# Patient Record
Sex: Male | Born: 1985 | Race: Black or African American | Hispanic: No | Marital: Single | Smoking: Current every day smoker
Health system: Southern US, Community
[De-identification: ages and names within clinical notes are randomized; demographics above are authoritative.]

## PROBLEM LIST (undated history)

## (undated) DIAGNOSIS — J45909 Unspecified asthma, uncomplicated: Secondary | ICD-10-CM

## (undated) DIAGNOSIS — T7840XA Allergy, unspecified, initial encounter: Secondary | ICD-10-CM

## (undated) HISTORY — PX: SHOULDER SURGERY: SHX246

## (undated) HISTORY — DX: Allergy, unspecified, initial encounter: T78.40XA

---

## 2005-11-13 ENCOUNTER — Emergency Department (HOSPITAL_COMMUNITY): Admission: EM | Admit: 2005-11-13 | Discharge: 2005-11-13 | Payer: Self-pay | Admitting: Emergency Medicine

## 2006-11-13 ENCOUNTER — Emergency Department (HOSPITAL_COMMUNITY): Admission: EM | Admit: 2006-11-13 | Discharge: 2006-11-13 | Payer: Self-pay | Admitting: Emergency Medicine

## 2007-08-03 ENCOUNTER — Emergency Department (HOSPITAL_COMMUNITY): Admission: EM | Admit: 2007-08-03 | Discharge: 2007-08-03 | Payer: Self-pay | Admitting: Emergency Medicine

## 2009-01-31 ENCOUNTER — Emergency Department (HOSPITAL_COMMUNITY): Admission: EM | Admit: 2009-01-31 | Discharge: 2009-01-31 | Payer: Self-pay | Admitting: Emergency Medicine

## 2009-12-29 ENCOUNTER — Emergency Department (HOSPITAL_COMMUNITY): Admission: EM | Admit: 2009-12-29 | Discharge: 2009-12-29 | Payer: Self-pay | Admitting: Emergency Medicine

## 2010-04-27 ENCOUNTER — Emergency Department (HOSPITAL_COMMUNITY): Admission: EM | Admit: 2010-04-27 | Discharge: 2010-04-27 | Payer: Self-pay | Admitting: Emergency Medicine

## 2010-05-12 ENCOUNTER — Emergency Department (HOSPITAL_COMMUNITY): Admission: EM | Admit: 2010-05-12 | Discharge: 2010-05-12 | Payer: Self-pay | Admitting: Emergency Medicine

## 2013-01-11 ENCOUNTER — Emergency Department (HOSPITAL_COMMUNITY)
Admission: EM | Admit: 2013-01-11 | Discharge: 2013-01-11 | Disposition: A | Discharge status: Home / Self Care | Payer: BC Managed Care – PPO | Attending: Emergency Medicine | Admitting: Emergency Medicine

## 2013-01-11 ENCOUNTER — Encounter (HOSPITAL_COMMUNITY): Payer: Self-pay | Admitting: Emergency Medicine

## 2013-01-11 DIAGNOSIS — J069 Acute upper respiratory infection, unspecified: Secondary | ICD-10-CM | POA: Insufficient documentation

## 2013-01-11 DIAGNOSIS — F17200 Nicotine dependence, unspecified, uncomplicated: Secondary | ICD-10-CM | POA: Insufficient documentation

## 2013-01-11 DIAGNOSIS — R0982 Postnasal drip: Secondary | ICD-10-CM | POA: Insufficient documentation

## 2013-01-11 DIAGNOSIS — J310 Chronic rhinitis: Secondary | ICD-10-CM

## 2013-01-11 DIAGNOSIS — J45909 Unspecified asthma, uncomplicated: Secondary | ICD-10-CM | POA: Insufficient documentation

## 2013-01-11 DIAGNOSIS — J3489 Other specified disorders of nose and nasal sinuses: Secondary | ICD-10-CM | POA: Insufficient documentation

## 2013-01-11 DIAGNOSIS — J029 Acute pharyngitis, unspecified: Secondary | ICD-10-CM

## 2013-01-11 HISTORY — DX: Unspecified asthma, uncomplicated: J45.909

## 2013-01-11 MED ORDER — FLUTICASONE PROPIONATE 50 MCG/ACT NA SUSP: 2.0000 | Freq: Every day | NASAL | Status: DC

## 2013-01-11 MED ORDER — ALBUTEROL SULFATE HFA 108 (90 BASE) MCG/ACT IN AERS
2.0000 | INHALATION_SPRAY | Freq: Once | RESPIRATORY_TRACT | Status: AC
  Administered 2013-01-11: 2 via RESPIRATORY_TRACT
  Filled 2013-01-11: qty 6.7

## 2013-01-11 NOTE — ED Provider Notes (Signed)
History     CSN: [id number]  Arrival date & time 01/11/13  1701   First MD Initiated Contact with Patient 01/11/13 1725      Chief Complaint  Patient presents with  . Sore Throat    (Consider location/radiation/quality/duration/timing/severity/associated sxs/prior treatment) HPI Comments: 27 year old male presents emergency department with his girlfriend complaining of a sore throat x1 day. States he woke up this morning when the symptoms began. He is concerned because he is traveling to [facility] on Friday and does not want this to get worse because when he is sick his asthma becomes exacerbated. He does not have an inhaler at this time. States his sore throat is not that bad "yet". Denies difficulty swallowing, breathing, fever, chills, cough. States he feels drip down the back of his throat causing him to clear his throat. His girlfriend is beginning to have similar symptoms.  Patient is a 27 y.o. male presenting with pharyngitis. The history is provided by the patient and a significant other.  Sore Throat Associated symptoms include a sore throat. Pertinent negatives include no chills, coughing or fever.    Past Medical History  Diagnosis Date  . Asthma     Past Surgical History  Procedure Laterality Date  . Shoulder surgery Left     No family history on file.  History  Substance Use Topics  . Smoking status: Current Every Day Smoker  . Smokeless tobacco: Not on file  . Alcohol Use: Yes      Review of Systems  Constitutional: Negative for fever and chills.  HENT: Positive for sore throat and postnasal drip. Negative for trouble swallowing and sinus pressure.   Respiratory: Negative for cough and shortness of breath.   All other systems reviewed and are negative.    Allergies  Review of patient's allergies indicates no known allergies.  Home Medications  No current outpatient prescriptions on file.  BP 118/57  Pulse 67  Temp(Src) 98.5 F (36.9 C) (Oral)   SpO2 97%  Physical Exam  Nursing note and vitals reviewed. Constitutional: He is oriented to person, place, and time. He appears well-developed and well-nourished. No distress.  HENT:  Head: Normocephalic and atraumatic.  Nose: Mucosal edema present. Right sinus exhibits no maxillary sinus tenderness and no frontal sinus tenderness. Left sinus exhibits no maxillary sinus tenderness and no frontal sinus tenderness.  Mouth/Throat: Uvula is midline and mucous membranes are normal. Posterior oropharyngeal erythema present. No oropharyngeal exudate or posterior oropharyngeal edema.  Postnasal drip present.  Eyes: Conjunctivae are normal.  Neck: Normal range of motion. Neck supple.  Cardiovascular: Normal rate, regular rhythm and normal heart sounds.   Pulmonary/Chest: Effort normal and breath sounds normal. No respiratory distress. He has no wheezes.  Musculoskeletal: Normal range of motion. He exhibits no edema.  Lymphadenopathy:    He has no cervical adenopathy.  Neurological: He is alert and oriented to person, place, and time.  Skin: Skin is warm and dry. He is not diaphoretic.  Psychiatric: He has a normal mood and affect. His behavior is normal.    ED Course  Procedures (including critical care time)  Labs Reviewed - No data to display No results found.   1. URI (upper respiratory infection)   2. Non-allergic rhinitis   3. Sore throat       MDM  27 year old male with viral URI. Marked mucosal edema and post nasal drip present on exam. He is in no apparent distress with normal vital signs. I prescribed him an  inhaler along with Flonase. Discussed importance of salt-water gargles and nasal saline rinses. Return precautions discussed. Patient states understanding of plan and is agreeable to      Trevor Mace, PA-C 01/11/13 1750

## 2013-01-11 NOTE — ED Notes (Signed)
Pt states that he woke up feeling sick/sore throat and is worried that he won't be able to breathe because his asthma becomes exacerbated when he is sick and he does not have an inhaler.

## 2013-01-15 NOTE — ED Provider Notes (Signed)
Medical screening examination/treatment/procedure(s) were performed by non-physician practitioner and as supervising physician I was immediately available for consultation/collaboration.    Jerric Oyen L Mixtli Reno, MD 01/15/13 1508 

## 2013-02-21 ENCOUNTER — Emergency Department (HOSPITAL_COMMUNITY): Payer: BC Managed Care – PPO

## 2013-02-21 ENCOUNTER — Encounter (HOSPITAL_COMMUNITY): Payer: Self-pay | Admitting: Emergency Medicine

## 2013-02-21 ENCOUNTER — Emergency Department (HOSPITAL_COMMUNITY)
Admission: EM | Admit: 2013-02-21 | Discharge: 2013-02-21 | Disposition: A | Discharge status: Home / Self Care | Payer: BC Managed Care – PPO | Attending: Emergency Medicine | Admitting: Emergency Medicine

## 2013-02-21 DIAGNOSIS — R6884 Jaw pain: Secondary | ICD-10-CM | POA: Insufficient documentation

## 2013-02-21 DIAGNOSIS — J45909 Unspecified asthma, uncomplicated: Secondary | ICD-10-CM | POA: Insufficient documentation

## 2013-02-21 DIAGNOSIS — K047 Periapical abscess without sinus: Secondary | ICD-10-CM

## 2013-02-21 DIAGNOSIS — R509 Fever, unspecified: Secondary | ICD-10-CM | POA: Insufficient documentation

## 2013-02-21 DIAGNOSIS — R1310 Dysphagia, unspecified: Secondary | ICD-10-CM | POA: Insufficient documentation

## 2013-02-21 DIAGNOSIS — R220 Localized swelling, mass and lump, head: Secondary | ICD-10-CM | POA: Insufficient documentation

## 2013-02-21 DIAGNOSIS — F17200 Nicotine dependence, unspecified, uncomplicated: Secondary | ICD-10-CM | POA: Insufficient documentation

## 2013-02-21 DIAGNOSIS — Z79899 Other long term (current) drug therapy: Secondary | ICD-10-CM | POA: Insufficient documentation

## 2013-02-21 LAB — CBC
HCT: 46.7 % (ref 39.0–52.0)
MCHC: 33.8 g/dL (ref 30.0–36.0)
Platelets: 231 10*3/uL (ref 150–400)
RDW: 12 % (ref 11.5–15.5)

## 2013-02-21 LAB — POCT I-STAT, CHEM 8
Glucose, Bld: 106 mg/dL — ABNORMAL HIGH (ref 70–99)
HCT: 51 % (ref 39.0–52.0)
Hemoglobin: 17.3 g/dL — ABNORMAL HIGH (ref 13.0–17.0)
Potassium: 3.8 mEq/L (ref 3.5–5.1)

## 2013-02-21 MED ORDER — CLINDAMYCIN PHOSPHATE 600 MG/50ML IV SOLN
600.0000 mg | Freq: Once | INTRAVENOUS | Status: AC
  Administered 2013-02-21: 600 mg via INTRAVENOUS
  Filled 2013-02-21: qty 50

## 2013-02-21 MED ORDER — IOHEXOL 300 MG/ML  SOLN
100.0000 mL | Freq: Once | INTRAMUSCULAR | Status: AC | PRN
  Administered 2013-02-21: 100 mL via INTRAVENOUS

## 2013-02-21 MED ORDER — IBUPROFEN 800 MG PO TABS
800.0000 mg | ORAL_TABLET | Freq: Once | ORAL | Status: AC
  Administered 2013-02-21: 800 mg via ORAL
  Filled 2013-02-21: qty 1

## 2013-02-21 MED ORDER — CLINDAMYCIN HCL 150 MG PO CAPS: 300.0000 mg | ORAL_CAPSULE | Freq: Three times a day (TID) | ORAL | Status: DC

## 2013-02-21 MED ORDER — OXYCODONE-ACETAMINOPHEN 5-325 MG PO TABS: 1.0000 | ORAL_TABLET | ORAL | Status: DC | PRN

## 2013-02-21 NOTE — ED Notes (Signed)
Pt states that he started having L mouth pain x 2 days ago and woke up with the L side of his face swollen. L lower dental pain.

## 2013-02-21 NOTE — ED Provider Notes (Signed)
History    This chart was scribed for non-physician practitioner working with Ward Givens, MD by Sofie Rower, ED Scribe. This patient was seen in room WTR7/WTR7 and the patient's care was started at 4:50PM.   CSN: [id number]  Arrival date & time 02/21/13  1636   First MD Initiated Contact with Patient 02/21/13 1650      Chief Complaint  Patient presents with  . Dental Pain    (Consider location/radiation/quality/duration/timing/severity/associated sxs/prior treatment) The history is provided by the patient. No language interpreter was used.    Mark Leon is a 27 y.o. male , with a hx of asthma, who presents to the Emergency Department complaining of non radiating, dental pain located at his left lower jaw, onset three days ago (02/18/13). Associated symptoms include fever (99.8 taken at [facility] on 02/21/13), chills, left sided facial swelling beginning yesterday (02/20/13), difficulty swallowing, and difficulty opening the jaw. The pt reports he is experiencing dental pain and swelling located at the left side of his jaw, which is inhibiting his ability to swallow and open his mouth. Additionally, the pt rates his dental pain at  6/10 at the present point and time. Modifying factors include application of pressure upon the left side of the face, specifically, while sleeping, which intensifies the dental pain.   The pt denies pain with swallowing, difficulty breathing, hoarse voice, nausea, and vomiting.   Furthermore, the pt denies any hx of dental abscess. The last time the pt was evaluated by a dentist was in 2010.   The pt is a current everyday smoker, in addition to drinking alcohol.   Pt does not have a Dentist at this time.    Past Medical History  Diagnosis Date  . Asthma     Past Surgical History  Procedure Laterality Date  . Shoulder surgery Left     History reviewed. No pertinent family history.  History  Substance Use Topics  . Smoking status: Current Every Day Smoker   . Smokeless tobacco: Not on file  . Alcohol Use: Yes      Review of Systems  Constitutional: Positive for fever and chills.  HENT: Positive for facial swelling, trouble swallowing and dental problem. Negative for sore throat, drooling, mouth sores, neck pain and voice change.   Respiratory: Negative for chest tightness and shortness of breath.   Cardiovascular: Negative for chest pain.  Gastrointestinal: Negative for nausea and vomiting.  Skin: Negative.   All other systems reviewed and are negative.    Allergies  Review of patient's allergies indicates no known allergies.  Home Medications   Current Outpatient Rx  Name  Route  Sig  Dispense  Refill  . albuterol (PROVENTIL HFA;VENTOLIN HFA) 108 (90 BASE) MCG/ACT inhaler   Inhalation   Inhale 2 puffs into the lungs every 6 (six) hours as needed for wheezing.         . clindamycin (CLEOCIN) 150 MG capsule   Oral   Take 2 capsules (300 mg total) by mouth 3 (three) times daily.   30 capsule   0   . oxyCODONE-acetaminophen (PERCOCET/ROXICET) 5-325 MG per tablet   Oral   Take 1 tablet by mouth every 4 (four) hours as needed for pain.   8 tablet   0     BP 135/74  Pulse 95  Temp(Src) 99.8 F (37.7 C) (Oral)  SpO2 100%  Physical Exam  Nursing note and vitals reviewed. Constitutional: He is oriented to person, place, and time. He appears  well-developed and well-nourished. No distress.  HENT:  Head: Normocephalic and atraumatic. There is trismus in the jaw.  Mouth/Throat: Mucous membranes are normal. He does not have dentures. No oral lesions. Normal dentition. Dental caries present. No dental abscesses.  Eyes: Conjunctivae and EOM are normal. Pupils are equal, round, and reactive to light.  Neck: Trachea normal, normal range of motion and full passive range of motion without pain. Neck supple. No spinous process tenderness and no muscular tenderness present. No rigidity. No tracheal deviation, no edema, no erythema  and normal range of motion present.    Visible soft tissue swelling left lower jaw area.  No erythema or warmth.   Cardiovascular: Normal rate, regular rhythm and normal heart sounds.  Exam reveals no gallop and no friction rub.   No murmur heard. Pulmonary/Chest: Effort normal and breath sounds normal. No respiratory distress. He has no wheezes.  Musculoskeletal: Normal range of motion.  Neurological: He is alert and oriented to person, place, and time.  Skin: Skin is warm and dry.  Psychiatric: He has a normal mood and affect. His behavior is normal.    ED Course  Procedures (including critical care time)  DIAGNOSTIC STUDIES: Oxygen Saturation is 100% on room air, normal by my interpretation.    COORDINATION OF CARE:   5:03 PM- Treatment plan concerning CT scan discussed with patient. Pt agrees with treatment.     Labs Reviewed  CBC - Abnormal; Notable for the following:    WBC 14.3 (*)    All other components within normal limits  POCT I-STAT, CHEM 8 - Abnormal; Notable for the following:    Glucose, Bld 106 (*)    Hemoglobin 17.3 (*)    All other components within normal limits    Results for orders placed during the hospital encounter of 02/21/13  CBC      Result Value Range   WBC 14.3 (*) 4.0 - 10.5 K/uL   RBC 4.97  4.22 - 5.81 MIL/uL   Hemoglobin 15.8  13.0 - 17.0 g/dL   HCT 16.1  09.6 - 04.5 %   MCV 94.0  78.0 - 100.0 fL   MCH 31.8  26.0 - 34.0 pg   MCHC 33.8  30.0 - 36.0 g/dL   RDW 40.9  81.1 - 91.4 %   Platelets 231  150 - 400 K/uL  POCT I-STAT, CHEM 8      Result Value Range   Sodium 141  135 - 145 mEq/L   Potassium 3.8  3.5 - 5.1 mEq/L   Chloride 104  96 - 112 mEq/L   BUN 12  6 - 23 mg/dL   Creatinine, Ser 7.82  0.50 - 1.35 mg/dL   Glucose, Bld 956 (*) 70 - 99 mg/dL   Calcium, Ion 2.13  0.86 - 1.23 mmol/L   TCO2 29  0 - 100 mmol/L   Hemoglobin 17.3 (*) 13.0 - 17.0 g/dL   HCT 57.8  46.9 - 62.9 %   Ct Maxillofacial W/cm  02/21/2013  *RADIOLOGY  REPORT*  Clinical Data: Dental pain in the left lower jaw.  Fever.  Left- sided facial swelling.  Difficulty swallowing.  Trismus.  CT MAXILLOFACIAL WITH CONTRAST  Technique:  Multidetector CT imaging of the maxillofacial structures was performed with intravenous contrast. Multiplanar CT image reconstructions were also generated.  Contrast: OMNIPAQUE IOHEXOL 300 MG/ML  SOLN  Comparison: None.  Findings: A large dental caries is present in the first left mandibular molar (tooth number 19).  There are.  Lucencies are present about the roots of both the first and second molars on the left.  Previous dental work is noted at the second molar.  No definite cortical erosion is present at either tooth.  However, the lucency extends to the alveolar ridge.  A subperiosteal abscess is evident along the buccal surface of the alveolar ridge of the left mandible, measuring 16 x 5 x 12 mm.  There is additional hypoattenuation extending posteriorly and superiorly within the masseter muscle.  No discrete abscess is present within the muscle.  Extensive subcutaneous edematous changes are present over left face.  Enlarged left submandibular lymph nodes are present.  The largest node measures 2.3 x 1.1 cm.  Additional level II lymph nodes are present bilaterally.  Limited imaging of the brain is unremarkable.  Limited imaging of the upper cervical spine is unremarkable.  IMPRESSION:  1.  Large dental caries involving the left first mandibular molar. 2.  Subperiosteal abscess along the vocal surface of the alveolar ridge at the level of that tooth. 3.  Extensive subcutaneous soft tissue swelling over the left side the face. 4.  Asymmetrically enlarged left submandibular lymph nodes.   Original Report Authenticated By: Marin Roberts, M.D.       1. Abscess, dental       MDM  Patient with facial swelling and dental pain x 2 days. Assoc. Subjective fevers and chills. Denies SOB. Soft tissue swelling in left lower jaw.  No visible dental abscess. No erythema or drainage in mouth.  CT maxillofacial w/ contrast shows subperiosteal abscess.  Will treat with Abx and pain medicine.  Urged patient to follow-up with dentist w/in 48 hours. Patient d/w with Dr. Lynelle Doctor, agrees with plan. Patient given return precautions. Patient agreeable to plan. Patient stable at time of discharge.          I personally performed the services described in this documentation, which was scribed in my presence. The recorded information has been reviewed and is accurate.     Jeannetta Ellis, PA-C 02/22/13 [phone]

## 2013-02-22 NOTE — ED Provider Notes (Signed)
Medical screening examination/treatment/procedure(s) were performed by non-physician practitioner and as supervising physician I was immediately available for consultation/collaboration. Devoria Albe, MD, Armando Gang   Ward Givens, MD 02/22/13 [phone]

## 2013-04-16 ENCOUNTER — Encounter (HOSPITAL_COMMUNITY): Payer: Self-pay | Admitting: *Deleted

## 2013-04-16 ENCOUNTER — Emergency Department (HOSPITAL_COMMUNITY)
Admission: EM | Admit: 2013-04-16 | Discharge: 2013-04-16 | Disposition: A | Discharge status: Home / Self Care | Payer: BC Managed Care – PPO | Attending: Emergency Medicine | Admitting: Emergency Medicine

## 2013-04-16 DIAGNOSIS — Z79899 Other long term (current) drug therapy: Secondary | ICD-10-CM | POA: Insufficient documentation

## 2013-04-16 DIAGNOSIS — J3489 Other specified disorders of nose and nasal sinuses: Secondary | ICD-10-CM | POA: Insufficient documentation

## 2013-04-16 DIAGNOSIS — R05 Cough: Secondary | ICD-10-CM | POA: Insufficient documentation

## 2013-04-16 DIAGNOSIS — R059 Cough, unspecified: Secondary | ICD-10-CM | POA: Insufficient documentation

## 2013-04-16 DIAGNOSIS — F17200 Nicotine dependence, unspecified, uncomplicated: Secondary | ICD-10-CM | POA: Insufficient documentation

## 2013-04-16 DIAGNOSIS — J4521 Mild intermittent asthma with (acute) exacerbation: Secondary | ICD-10-CM

## 2013-04-16 DIAGNOSIS — J45901 Unspecified asthma with (acute) exacerbation: Secondary | ICD-10-CM | POA: Insufficient documentation

## 2013-04-16 MED ORDER — PREDNISONE 20 MG PO TABS: 40.0000 mg | ORAL_TABLET | Freq: Every day | ORAL | Status: DC

## 2013-04-16 MED ORDER — ALBUTEROL SULFATE HFA 108 (90 BASE) MCG/ACT IN AERS
2.0000 | INHALATION_SPRAY | Freq: Once | RESPIRATORY_TRACT | Status: AC
  Administered 2013-04-16: 2 via RESPIRATORY_TRACT
  Filled 2013-04-16: qty 6.7

## 2013-04-16 NOTE — ED Notes (Signed)
Pt states he caught a virus on Tuesday and Wednesday he had fevers and chills,  Nasal congestion

## 2013-04-16 NOTE — ED Provider Notes (Signed)
History     CSN: [id number]  Arrival date & time 04/16/13  4098   First MD Initiated Contact with Patient 04/16/13 [phone]      Chief Complaint  Patient presents with  . Nasal Congestion    (Consider location/radiation/quality/duration/timing/severity/associated sxs/prior treatment) HPI Comments: 27 year old male, history of asthma, presents with a complaint of coughing and shortness of breath. He states that he has been congested in his chest for several days, persistent, nothing makes better or worse, not associated with fevers chills nausea vomiting abdominal pain back pain swelling or rashes. He has been out of his albuterol medications for some time, trying to make them last but has not been able to get them refilled. He has not had treatment for this prior to arrival. He states that the cough is nonproductive  The history is provided by the patient and a friend.    Past Medical History  Diagnosis Date  . Asthma     Past Surgical History  Procedure Laterality Date  . Shoulder surgery Left     No family history on file.  History  Substance Use Topics  . Smoking status: Current Every Day Smoker  . Smokeless tobacco: Not on file  . Alcohol Use: Yes      Review of Systems  All other systems reviewed and are negative.    Allergies  Review of patient's allergies indicates no known allergies.  Home Medications   Current Outpatient Rx  Name  Route  Sig  Dispense  Refill  . albuterol (PROVENTIL HFA;VENTOLIN HFA) 108 (90 BASE) MCG/ACT inhaler   Inhalation   Inhale 2 puffs into the lungs every 6 (six) hours as needed for wheezing.         Marland Kitchen guaiFENesin (MUCINEX) 600 MG 12 hr tablet   Oral   Take 600 mg by mouth 2 (two) times daily as needed for congestion.         Marland Kitchen OVER THE COUNTER MEDICATION   Oral   Take 1 tablet by mouth daily as needed (for allergies). OTC generic allergy medication         . predniSONE (DELTASONE) 20 MG tablet   Oral   Take 2  tablets (40 mg total) by mouth daily.   10 tablet   0     BP 121/71  Pulse 75  Temp(Src) 98.5 F (36.9 C) (Oral)  Resp 18  SpO2 98%  Physical Exam  Nursing note and vitals reviewed. Constitutional: He appears well-developed and well-nourished. No distress.  HENT:  Head: Normocephalic and atraumatic.  Mouth/Throat: Oropharynx is clear and moist. No oropharyngeal exudate.  Eyes: Conjunctivae and EOM are normal. Pupils are equal, round, and reactive to light. Right eye exhibits no discharge. Left eye exhibits no discharge. No scleral icterus.  Neck: Normal range of motion. Neck supple. No JVD present. No thyromegaly present.  Cardiovascular: Normal rate, regular rhythm, normal heart sounds and intact distal pulses.  Exam reveals no gallop and no friction rub.   No murmur heard. Pulmonary/Chest: Effort normal. No respiratory distress. He has wheezes. He has no rales.  Abdominal: Soft. Bowel sounds are normal. He exhibits no distension and no mass. There is no tenderness.  Musculoskeletal: Normal range of motion. He exhibits no edema and no tenderness.  Lymphadenopathy:    He has no cervical adenopathy.  Neurological: He is alert. Coordination normal.  Skin: Skin is warm and dry. No rash noted. No erythema.  Psychiatric: He has a normal mood and affect. His  behavior is normal.    ED Course  Procedures (including critical care time)  Labs Reviewed - No data to display No results found.   1. Asthma, intermittent with acute exacerbation       MDM  The patient is in no respiratory distress but he does have diffuse expiratory wheezing and prolonged expiratory phase. He is speaking in full sentences, oxygen of 98-100% and is afebrile with no tachycardia or tachypnea. We'll give treatment with albuterol MDI with spacer, prednisone 5 day course, followup with family Dr. Patient expresses understanding to the treatment plan.   Meds given in ED:  Medications  albuterol (PROVENTIL  HFA;VENTOLIN HFA) 108 (90 BASE) MCG/ACT inhaler 2 puff (not administered)    New Prescriptions   PREDNISONE (DELTASONE) 20 MG TABLET    Take 2 tablets (40 mg total) by mouth daily.            Vida Roller, MD 04/16/13 [phone]

## 2013-04-16 NOTE — ED Notes (Signed)
Discharge instructions reviewed w/ pt., verbalizes understanding. One prescription provided at discharge, inhaler sent home w/ pt as well.

## 2013-11-20 ENCOUNTER — Emergency Department (HOSPITAL_COMMUNITY)
Admission: EM | Admit: 2013-11-20 | Discharge: 2013-11-20 | Disposition: A | Discharge status: Home / Self Care | Payer: BC Managed Care – PPO | Attending: Emergency Medicine | Admitting: Emergency Medicine

## 2013-11-20 ENCOUNTER — Emergency Department (HOSPITAL_COMMUNITY): Payer: BC Managed Care – PPO

## 2013-11-20 ENCOUNTER — Encounter (HOSPITAL_COMMUNITY): Payer: Self-pay | Admitting: Emergency Medicine

## 2013-11-20 DIAGNOSIS — R197 Diarrhea, unspecified: Secondary | ICD-10-CM | POA: Insufficient documentation

## 2013-11-20 DIAGNOSIS — Z79899 Other long term (current) drug therapy: Secondary | ICD-10-CM | POA: Insufficient documentation

## 2013-11-20 DIAGNOSIS — J45901 Unspecified asthma with (acute) exacerbation: Secondary | ICD-10-CM

## 2013-11-20 DIAGNOSIS — F17200 Nicotine dependence, unspecified, uncomplicated: Secondary | ICD-10-CM | POA: Insufficient documentation

## 2013-11-20 MED ORDER — PREDNISONE 10 MG PO TABS: ORAL_TABLET | ORAL | Status: DC

## 2013-11-20 MED ORDER — ALBUTEROL SULFATE HFA 108 (90 BASE) MCG/ACT IN AERS
2.0000 | INHALATION_SPRAY | Freq: Once | RESPIRATORY_TRACT | Status: AC
  Administered 2013-11-20: 2 via RESPIRATORY_TRACT
  Filled 2013-11-20: qty 6.7

## 2013-11-20 MED ORDER — PREDNISONE 20 MG PO TABS
60.0000 mg | ORAL_TABLET | Freq: Once | ORAL | Status: AC
  Administered 2013-11-20: 60 mg via ORAL
  Filled 2013-11-20: qty 3

## 2013-11-20 MED ORDER — ALBUTEROL SULFATE (2.5 MG/3ML) 0.083% IN NEBU
5.0000 mg | INHALATION_SOLUTION | Freq: Once | RESPIRATORY_TRACT | Status: AC
  Administered 2013-11-20: 5 mg via RESPIRATORY_TRACT
  Filled 2013-11-20: qty 6

## 2013-11-20 NOTE — Discharge Instructions (Signed)
Use your albuterol inhaler 2 puffs every 4 hrs. Take prednisone as prescribed until all gone. Follow up with your doctor.    Asthma, Adult Asthma is a recurring condition in which the airways tighten and narrow. Asthma can make it difficult to breathe. It can cause coughing, wheezing, and shortness of breath. Asthma episodes (also called asthma attacks) range from minor to life-threatening. Asthma cannot be cured, but medicines and lifestyle changes can help control it. CAUSES Asthma is believed to be caused by inherited (genetic) and environmental factors, but its exact cause is unknown. Asthma may be triggered by allergens, lung infections, or irritants in the air. Asthma triggers are different for each person. Common triggers include:   Animal dander.  Dust mites.  Cockroaches.  Pollen from trees or grass.  Mold.  Smoke.  Air pollutants such as dust, household cleaners, hair sprays, aerosol sprays, paint fumes, strong chemicals, or strong odors.  Cold air, weather changes, and winds (which increase molds and pollens in the air).  Strong emotional expressions such as crying or laughing hard.  Stress.  Certain medicines (such as aspirin) or types of drugs (such as beta-blockers).  Sulfites in foods and drinks. Foods and drinks that may contain sulfites include dried fruit, potato chips, and sparkling grape juice.  Infections or inflammatory conditions such as the flu, a cold, or an inflammation of the nasal membranes (rhinitis).  Gastroesophageal reflux disease (GERD).  Exercise or strenuous activity. SYMPTOMS Symptoms may occur immediately after asthma is triggered or many hours later. Symptoms include:  Wheezing.  Excessive nighttime or early morning coughing.  Frequent or severe coughing with a common cold.  Chest tightness.  Shortness of breath. DIAGNOSIS  The diagnosis of asthma is made by a review of your medical history and a physical exam. Tests may also be  performed. These may include:  Lung function studies. These tests show how much air you breath in and out.  Allergy tests.  Imaging tests such as X-rays. TREATMENT  Asthma cannot be cured, but it can usually be controlled. Treatment involves identifying and avoiding your asthma triggers. It also involves medicines. There are 2 classes of medicine used for asthma treatment:   Controller medicines. These prevent asthma symptoms from occurring. They are usually taken every day.  Reliever or rescue medicines. These quickly relieve asthma symptoms. They are used as needed and provide short-term relief. Your health care provider will help you create an asthma action plan. An asthma action plan is a written plan for managing and treating your asthma attacks. It includes a list of your asthma triggers and how they may be avoided. It also includes information on when medicines should be taken and when their dosage should be changed. An action plan may also involve the use of a device called a peak flow meter. A peak flow meter measures how well the lungs are working. It helps you monitor your condition. HOME CARE INSTRUCTIONS   Take medicine as directed by your health care provider. Speak with your health care provider if you have questions about how or when to take the medicines.  Use a peak flow meter as directed by your health care provider. Record and keep track of readings.  Understand and use the action plan to help minimize or stop an asthma attack without needing to seek medical care.  Control your home environment in the following ways to help prevent asthma attacks:  Do not smoke. Avoid being exposed to secondhand smoke.  Change your  heating and air conditioning filter regularly.  Limit your use of fireplaces and wood stoves.  Get rid of pests (such as roaches and mice) and their droppings.  Throw away plants if you see mold on them.  Clean your floors and dust regularly. Use  unscented cleaning products.  Try to have someone else vacuum for you regularly. Stay out of rooms while they are being vacuumed and for a short while afterward. If you vacuum, use a dust mask from a hardware store, a double-layered or microfilter vacuum cleaner bag, or a vacuum cleaner with a HEPA filter.  Replace carpet with wood, tile, or vinyl flooring. Carpet can trap dander and dust.  Use allergy-proof pillows, mattress covers, and box spring covers.  Wash bed sheets and blankets every week in hot water and dry them in a dryer.  Use blankets that are made of polyester or cotton.  Clean bathrooms and kitchens with bleach. If possible, have someone repaint the walls in these rooms with mold-resistant paint. Keep out of the rooms that are being cleaned and painted.  Wash hands frequently. SEEK MEDICAL CARE IF:   You have wheezing, shortness of breath, or a cough even if taking medicine to prevent attacks.  The colored mucus you cough up (sputum) is thicker than usual.  Your sputum changes from clear or white to yellow, green, gray, or bloody.  You have any problems that may be related to the medicines you are taking (such as a rash, itching, swelling, or trouble breathing).  You are using a reliever medicine more than 2 3 times per week.  Your peak flow is still at 50 79% of you personal best after following your action plan for 1 hour. SEEK IMMEDIATE MEDICAL CARE IF:   You seem to be getting worse and are unresponsive to treatment during an asthma attack.  You are short of breath even at rest.  You get short of breath when doing very little physical activity.  You have difficulty eating, drinking, or talking due to asthma symptoms.  You develop chest pain.  You develop a fast heartbeat.  You have a bluish color to your lips or fingernails.  You are lightheaded, dizzy, or faint.  Your peak flow is less than 50% of your personal best.  You have a fever or persistent  symptoms for more than 2 3 days.  You have a fever and symptoms suddenly get worse. MAKE SURE YOU:   Understand these instructions.  Will watch your condition.  Will get help right away if you are not doing well or get worse. Document Released: 11/02/2005 Document Revised: 07/05/2013 Document Reviewed: 06/01/2013 [facility] Patient Information 2014 [place], [place].

## 2013-11-20 NOTE — ED Notes (Signed)
Pt c/o chest tightness that started last night but the chest pain didn't start until this morning while at work.  Pt states he had one episode of diarrhea this morning.  Pt has asthma.

## 2013-11-20 NOTE — ED Provider Notes (Signed)
CSN: [id number]     Arrival date & time 11/20/13  1750 History   First MD Initiated Contact with Patient 11/20/13 2113     Chief Complaint  Patient presents with  . Asthma  . Chest Pain  . Diarrhea   (Consider location/radiation/quality/duration/timing/severity/associated sxs/prior Treatment) HPI Mark Leon is a 28 y.o. male who presents to emergency department and complaining of chest pain or shortness of breath. He states symptoms have been on and off since yesterday. He states he has been using his inhaler with no improvement. He denies any cough. He states he has history of asthma and this feels similar to prior exacerbations. He denies any known asthma triggers however he states he was outside this morning and it was we will being cold. He also admits to some mold in his bathroom. He denies any fever, chills, nasal congestion, sore throat.   Past Medical History  Diagnosis Date  . Asthma    Past Surgical History  Procedure Laterality Date  . Shoulder surgery Left    No family history on file. History  Substance Use Topics  . Smoking status: Current Every Day Smoker    Types: Cigarettes  . Smokeless tobacco: Not on file  . Alcohol Use: Yes    Review of Systems  Constitutional: Negative for fever and chills.  Respiratory: Positive for chest tightness and shortness of breath. Negative for cough.   Cardiovascular: Negative for chest pain, palpitations and leg swelling.  Gastrointestinal: Negative for nausea, vomiting, abdominal pain, diarrhea and abdominal distention.  Genitourinary: Negative for dysuria, urgency, frequency and hematuria.  Musculoskeletal: Negative for arthralgias, myalgias, neck pain and neck stiffness.  Skin: Negative for rash.  Allergic/Immunologic: Negative for immunocompromised state.  Neurological: Negative for dizziness, weakness, light-headedness, numbness and headaches.    Allergies  Review of patient's allergies indicates no known  allergies.  Home Medications   Current Outpatient Rx  Name  Route  Sig  Dispense  Refill  . albuterol (PROVENTIL HFA;VENTOLIN HFA) 108 (90 BASE) MCG/ACT inhaler   Inhalation   Inhale 2 puffs into the lungs every 6 (six) hours as needed for wheezing.         Marland Kitchen guaiFENesin (MUCINEX) 600 MG 12 hr tablet   Oral   Take 600 mg by mouth 2 (two) times daily as needed for congestion.          BP 135/78  Pulse 81  Temp(Src) 98.9 F (37.2 C) (Oral)  Resp 18  SpO2 99% Physical Exam  Nursing note and vitals reviewed. Constitutional: He appears well-developed and well-nourished. No distress.  HENT:  Head: Normocephalic.  Right Ear: External ear normal.  Left Ear: External ear normal.  Nose: Nose normal.  Mouth/Throat: Oropharynx is clear and moist.  Eyes: Conjunctivae are normal.  Neck: Normal range of motion. Neck supple.  Cardiovascular: Normal rate, regular rhythm and normal heart sounds.   Pulmonary/Chest: Effort normal and breath sounds normal. No respiratory distress. He has no wheezes. He has no rales.  Skin: Skin is warm and dry.    ED Course  Procedures (including critical care time) Labs Review Labs Reviewed - No data to display Imaging Review Dg Chest 2 View  11/20/2013   CLINICAL DATA:  Asthma in chest  EXAM: CHEST  2 VIEW  COMPARISON:  12/29/2009  FINDINGS: The heart size and mediastinal contours are within normal limits. Both lungs are clear. No pneumothorax. The visualized skeletal structures are unremarkable.  IMPRESSION: No active cardiopulmonary disease.  Electronically Signed   By: Tiburcio PeaJonathan  Watts M.D.   On: 11/20/2013 21:53    EKG Interpretation   None       MDM  No diagnosis found.  Patient with chest tightness and shortness of breath that was on and off since yesterday. History of asthma. He received a neb treatment in triage which helped. He is currently nontoxic appearing and does not appear to be in any respiratory distress. He does not have any  wheezing on exam. His lungs are clear. He's afebrile. His oxygen saturations 99% on room air. I will place him on a short steroid taper and continue albuterol inhaler. He is to followup with primary care Dr. Chest x-ray is negative.    Lottie Musselatyana A Rohith Fauth, PA-C 11/20/13 2235

## 2013-11-20 NOTE — ED Notes (Signed)
Patient transported to X-ray 

## 2013-11-21 NOTE — ED Provider Notes (Signed)
Medical screening examination/treatment/procedure(s) were performed by non-physician practitioner and as supervising physician I was immediately available for consultation/collaboration.  EKG Interpretation   None         Junius ArgyleForrest S Trelyn Vanderlinde, MD 11/21/13 1144

## 2013-11-22 ENCOUNTER — Emergency Department (HOSPITAL_COMMUNITY)
Admission: EM | Admit: 2013-11-22 | Discharge: 2013-11-22 | Disposition: A | Discharge status: Home / Self Care | Payer: BC Managed Care – PPO | Attending: Emergency Medicine | Admitting: Emergency Medicine

## 2013-11-22 ENCOUNTER — Encounter (HOSPITAL_COMMUNITY): Payer: Self-pay | Admitting: Emergency Medicine

## 2013-11-22 DIAGNOSIS — Z79899 Other long term (current) drug therapy: Secondary | ICD-10-CM | POA: Insufficient documentation

## 2013-11-22 DIAGNOSIS — J45909 Unspecified asthma, uncomplicated: Secondary | ICD-10-CM | POA: Insufficient documentation

## 2013-11-22 DIAGNOSIS — R197 Diarrhea, unspecified: Secondary | ICD-10-CM | POA: Insufficient documentation

## 2013-11-22 DIAGNOSIS — F17200 Nicotine dependence, unspecified, uncomplicated: Secondary | ICD-10-CM | POA: Insufficient documentation

## 2013-11-22 DIAGNOSIS — J111 Influenza due to unidentified influenza virus with other respiratory manifestations: Secondary | ICD-10-CM

## 2013-11-22 DIAGNOSIS — IMO0002 Reserved for concepts with insufficient information to code with codable children: Secondary | ICD-10-CM | POA: Insufficient documentation

## 2013-11-22 MED ORDER — OSELTAMIVIR PHOSPHATE 75 MG PO CAPS: 75.0000 mg | ORAL_CAPSULE | Freq: Two times a day (BID) | ORAL | Status: DC

## 2013-11-22 NOTE — ED Provider Notes (Signed)
CSN: [id number]     Arrival date & time 11/22/13  [id number] History   First MD Initiated Contact with Patient 11/22/13 1010     Chief Complaint  Patient presents with  . Diarrhea  . Chills  . Generalized Body Aches   (Consider location/radiation/quality/duration/timing/severity/associated sxs/prior Treatment) Patient is a 28 y.o. male presenting with diarrhea. The history is provided by the patient.  Diarrhea Associated symptoms: chills and fever   Associated symptoms: no headaches and no vomiting   pt c/o in past 1 day, onset subjective fever, chills, body aches, non prod cough, nasal congestion, scratchy throat, and couple episodes diarrhea, not bloody or mucousy. No known ill contacts. Denies cp or sob. No abd pain. No vomiting. Is eating and drinking. No severe headaches or neck pain/stiffness.  No recent foreign travel.      Past Medical History  Diagnosis Date  . Asthma    Past Surgical History  Procedure Laterality Date  . Shoulder surgery Left    No family history on file. History  Substance Use Topics  . Smoking status: Current Every Day Smoker    Types: Cigarettes  . Smokeless tobacco: Not on file  . Alcohol Use: Yes    Review of Systems  Constitutional: Positive for fever and chills.  HENT: Positive for congestion and rhinorrhea. Negative for trouble swallowing.   Eyes: Negative for discharge and redness.  Respiratory: Positive for cough. Negative for shortness of breath.   Cardiovascular: Negative for chest pain.  Gastrointestinal: Positive for diarrhea. Negative for vomiting.  Genitourinary: Negative for dysuria.  Musculoskeletal: Negative for back pain and neck pain.  Skin: Negative for rash.  Neurological: Negative for headaches.  Hematological: Negative for adenopathy.  Psychiatric/Behavioral: Negative for confusion.    Allergies  Review of patient's allergies indicates no known allergies.  Home Medications   Current Outpatient Rx  Name  Route  Sig   Dispense  Refill  . albuterol (PROVENTIL HFA;VENTOLIN HFA) 108 (90 BASE) MCG/ACT inhaler   Inhalation   Inhale 2 puffs into the lungs every 6 (six) hours as needed for wheezing.         Marland Kitchen. guaiFENesin (MUCINEX) 600 MG 12 hr tablet   Oral   Take 600 mg by mouth 2 (two) times daily as needed for congestion.         Marland Kitchen. oseltamivir (TAMIFLU) 75 MG capsule   Oral   Take 1 capsule (75 mg total) by mouth 2 (two) times daily.   10 capsule   0   . predniSONE (DELTASONE) 10 MG tablet      Take 5 tab day 1, take 4 tab day 2, take 3 tab day 3, take 2 tab day 4, and take 1 tab day 5   15 tablet   0    BP 113/73  Pulse 78  Temp(Src) 98.6 F (37 C) (Oral)  Resp 16  SpO2 98% Physical Exam  Nursing note and vitals reviewed. Constitutional: He is oriented to person, place, and time. He appears well-developed and well-nourished. No distress.  HENT:  Head: Atraumatic.  Mouth/Throat: Oropharynx is clear and moist.  Nasal congestion. No sinus tenderness.   Eyes: Conjunctivae are normal. Pupils are equal, round, and reactive to light. No scleral icterus.  Neck: Normal range of motion. Neck supple. No tracheal deviation present.  No stiffness or rigidity  Cardiovascular: Normal rate, regular rhythm, normal heart sounds and intact distal pulses.  Exam reveals no gallop and no friction rub.   No  murmur heard. Pulmonary/Chest: Effort normal and breath sounds normal. No accessory muscle usage. No respiratory distress.  Abdominal: Soft. Bowel sounds are normal. He exhibits no distension and no mass. There is no tenderness. There is no rebound and no guarding.  Genitourinary:  No cva tenderness  Musculoskeletal: Normal range of motion. He exhibits no edema and no tenderness.  Lymphadenopathy:    He has no cervical adenopathy.  Neurological: He is alert and oriented to person, place, and time.  Steady gait.   Skin: Skin is warm and dry. No rash noted. He is not diaphoretic.  Psychiatric: He has  a normal mood and affect.    ED Course  Procedures (including critical care time)  Dg Chest 2 View  11/20/2013   CLINICAL DATA:  Asthma in chest  EXAM: CHEST  2 VIEW  COMPARISON:  12/29/2009  FINDINGS: The heart size and mediastinal contours are within normal limits. Both lungs are clear. No pneumothorax. The visualized skeletal structures are unremarkable.  IMPRESSION: No active cardiopulmonary disease.   Electronically Signed   By: Tiburcio Pea M.D.   On: 11/20/2013 21:53      MDM  Pt tolerating po fluids.  Pulse ox 98%. No increased wob.  Pt appears stable for d/c.   Reviewed nursing notes and prior charts for additional history.      Suzi Roots, MD 11/22/13 1045

## 2013-11-22 NOTE — ED Notes (Addendum)
Pt c/o chills, body aches, and diarrhea since yesterday.  Pt has only had 2 loose stools.

## 2013-11-22 NOTE — Discharge Instructions (Signed)
Rest. Drink plenty of fluids. Take tamiflu as prescribed. Take tylenol/advil as need for symptom relief. Follow up with primary care doctor in 1 week if symptoms fail to improve/resolve. Return to ER if worse, trouble breathing, severe diarrhea, other concern.     Influenza, Adult Influenza ("the flu") is a viral infection of the respiratory tract. It occurs more often in winter months because people spend more time in close contact with one another. Influenza can make you feel very sick. Influenza easily spreads from person to person (contagious). CAUSES  Influenza is caused by a virus that infects the respiratory tract. You can catch the virus by breathing in droplets from an infected person's cough or sneeze. You can also catch the virus by touching something that was recently contaminated with the virus and then touching your mouth, nose, or eyes. SYMPTOMS  Symptoms typically last 4 to 10 days and may include:  Fever.  Chills.  Headache, body aches, and muscle aches.  Sore throat.  Chest discomfort and cough.  Poor appetite.  Weakness or feeling tired.  Dizziness.  Nausea or vomiting. DIAGNOSIS  Diagnosis of influenza is often made based on your history and a physical exam. A nose or throat swab test can be done to confirm the diagnosis. RISKS AND COMPLICATIONS You may be at risk for a more severe case of influenza if you smoke cigarettes, have diabetes, have chronic heart disease (such as heart failure) or lung disease (such as asthma), or if you have a weakened immune system. Elderly people and pregnant women are also at risk for more serious infections. The most common complication of influenza is a lung infection (pneumonia). Sometimes, this complication can require emergency medical care and may be life-threatening. PREVENTION  An annual influenza vaccination (flu shot) is the best way to avoid getting influenza. An annual flu shot is now routinely recommended for all  adults in the [place] TREATMENT  In mild cases, influenza goes away on its own. Treatment is directed at relieving symptoms. For more severe cases, your caregiver may prescribe antiviral medicines to shorten the sickness. Antibiotic medicines are not effective, because the infection is caused by a virus, not by bacteria. HOME CARE INSTRUCTIONS  Only take over-the-counter or prescription medicines for pain, discomfort, or fever as directed by your caregiver.  Use a cool mist humidifier to make breathing easier.  Get plenty of rest until your temperature returns to normal. This usually takes 3 to 4 days.  Drink enough fluids to keep your urine clear or pale yellow.  Cover your mouth and nose when coughing or sneezing, and wash your hands well to avoid spreading the virus.  Stay home from work or school until your fever has been gone for at least 1 full day. SEEK MEDICAL CARE IF:   You have chest pain or a deep cough that worsens or produces more mucus.  You have nausea, vomiting, or diarrhea. SEEK IMMEDIATE MEDICAL CARE IF:   You have difficulty breathing, shortness of breath, or your skin or nails turn bluish.  You have severe neck pain or stiffness.  You have a severe headache, facial pain, or earache.  You have a worsening or recurring fever.  You have nausea or vomiting that cannot be controlled. MAKE SURE YOU:  Understand these instructions.  Will watch your condition.  Will get help right away if you are not doing well or get worse. Document Released: 10/30/2000 Document Revised: 05/03/2012 Document Reviewed: 02/01/2012 [facility] Patient Information 2014 [place], [place].  Diarrhea Diarrhea is frequent loose and watery bowel movements. It can cause you to feel weak and dehydrated. Dehydration can cause you to become tired and thirsty, have a dry mouth, and have decreased urination that often is dark yellow. Diarrhea is a sign of another problem, most often an infection  that will not last long. In most cases, diarrhea typically lasts 2 3 days. However, it can last longer if it is a sign of something more serious. It is important to treat your diarrhea as directed by your caregive to lessen or prevent future episodes of diarrhea. CAUSES  Some common causes include:  Gastrointestinal infections caused by viruses, bacteria, or parasites.  Food poisoning or food allergies.  Certain medicines, such as antibiotics, chemotherapy, and laxatives.  Artificial sweeteners and fructose.  Digestive disorders. HOME CARE INSTRUCTIONS  Ensure adequate fluid intake (hydration): have 1 cup (8 oz) of fluid for each diarrhea episode. Avoid fluids that contain simple sugars or sports drinks, fruit juices, whole milk products, and sodas. Your urine should be clear or pale yellow if you are drinking enough fluids. Hydrate with an oral rehydration solution that you can purchase at pharmacies, retail stores, and online. You can prepare an oral rehydration solution at home by mixing the following ingredients together:    tsp table salt.   tsp baking soda.   tsp salt substitute containing potassium chloride.  1  tablespoons sugar.  1 L (34 oz) of water.  Certain foods and beverages may increase the speed at which food moves through the gastrointestinal (GI) tract. These foods and beverages should be avoided and include:  Caffeinated and alcoholic beverages.  High-fiber foods, such as raw fruits and vegetables, nuts, seeds, and whole grain breads and cereals.  Foods and beverages sweetened with sugar alcohols, such as xylitol, sorbitol, and mannitol.  Some foods may be well tolerated and may help thicken stool including:  Starchy foods, such as rice, toast, pasta, low-sugar cereal, oatmeal, grits, baked potatoes, crackers, and bagels.  Bananas.  Applesauce.  Add probiotic-rich foods to help increase healthy bacteria in the GI tract, such as yogurt and fermented milk  products.  Wash your hands well after each diarrhea episode.  Only take over-the-counter or prescription medicines as directed by your caregiver.  Take a warm bath to relieve any burning or pain from frequent diarrhea episodes. SEEK IMMEDIATE MEDICAL CARE IF:   You are unable to keep fluids down.  You have persistent vomiting.  You have blood in your stool, or your stools are black and tarry.  You do not urinate in 6 8 hours, or there is only a small amount of very dark urine.  You have abdominal pain that increases or localizes.  You have weakness, dizziness, confusion, or lightheadedness.  You have a severe headache.  Your diarrhea gets worse or does not get better.  You have a fever or persistent symptoms for more than 2 3 days.  You have a fever and your symptoms suddenly get worse. MAKE SURE YOU:   Understand these instructions.  Will watch your condition.  Will get help right away if you are not doing well or get worse. Document Released: 10/23/2002 Document Revised: 10/19/2012 Document Reviewed: 07/10/2012 [facility] Patient Information 2014 [place].

## 2014-01-18 IMAGING — CR DG CHEST 2V
2 series · 2 of 2 positions shown · non-contrast
Comparison: 12/29/2009

CLINICAL DATA: Asthma in chest

EXAM:
CHEST  2 VIEW

[w chest pa]
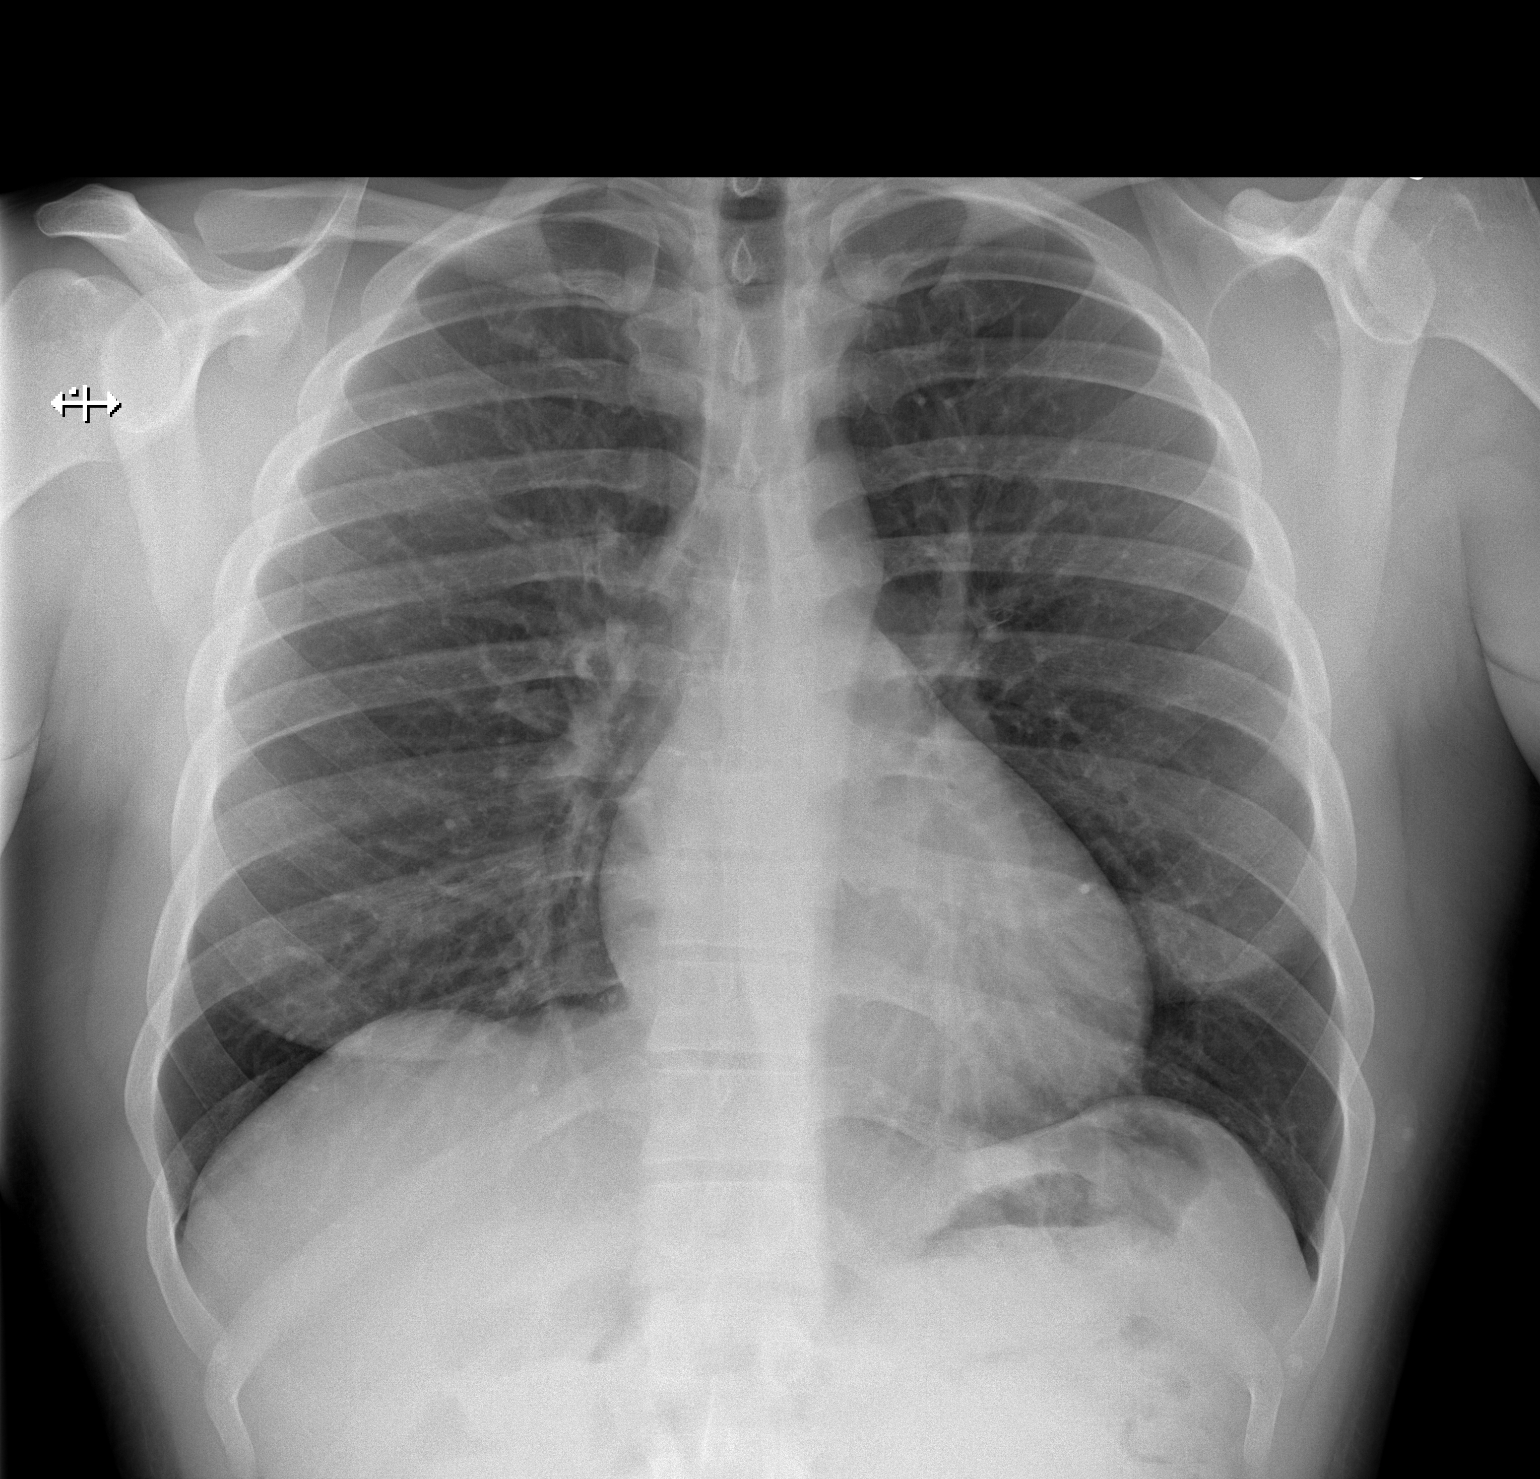

[w chest lat]
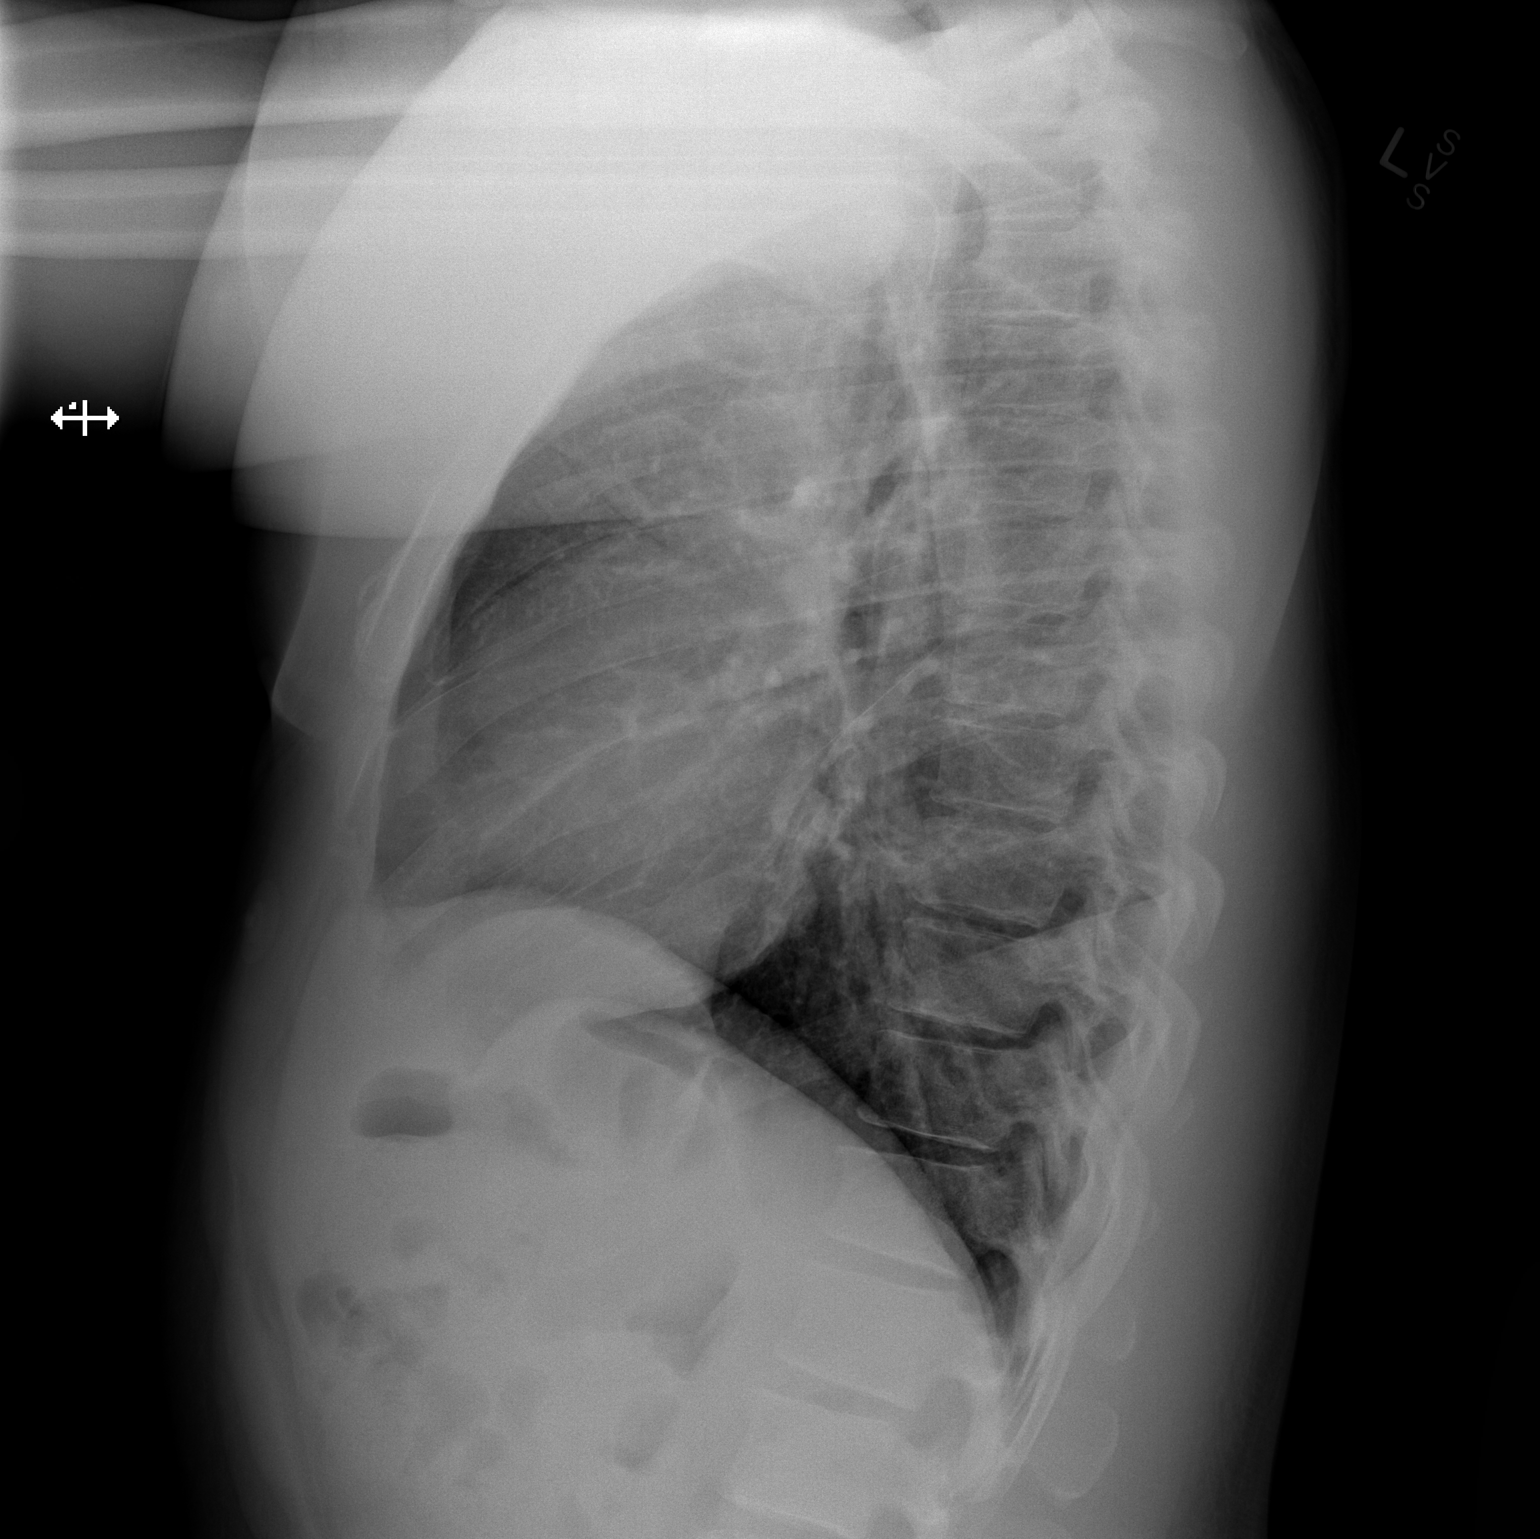

[2 of 2 positions shown; findings below may reference images not displayed]

FINDINGS: The heart size and mediastinal contours are within normal limits.
Both lungs are clear. No pneumothorax. The visualized skeletal
structures are unremarkable.
IMPRESSION: No active cardiopulmonary disease.

## 2015-03-06 ENCOUNTER — Ambulatory Visit (INDEPENDENT_AMBULATORY_CARE_PROVIDER_SITE_OTHER): Payer: BLUE CROSS/BLUE SHIELD | Admitting: Emergency Medicine

## 2015-03-06 VITALS — BP 122/78 | HR 83 | Temp 98.4°F | Resp 20 | Ht 70.0 in | Wt 242.0 lb

## 2015-03-06 DIAGNOSIS — Z0000 Encounter for general adult medical examination without abnormal findings: Secondary | ICD-10-CM

## 2015-03-06 LAB — CBC WITH DIFFERENTIAL/PLATELET
Basophils Absolute: 0.1 K/uL (ref 0.0–0.1)
Basophils Relative: 1 % (ref 0–1)
Eosinophils Absolute: 0.3 K/uL (ref 0.0–0.7)
Eosinophils Relative: 4 % (ref 0–5)
HCT: 45.2 % (ref 39.0–52.0)
Hemoglobin: 15.5 g/dL (ref 13.0–17.0)
Lymphocytes Relative: 33 % (ref 12–46)
Lymphs Abs: 2.4 K/uL (ref 0.7–4.0)
MCH: 31.8 pg (ref 26.0–34.0)
MCHC: 34.3 g/dL (ref 30.0–36.0)
MCV: 92.6 fL (ref 78.0–100.0)
MPV: 12 fL (ref 8.6–12.4)
Monocytes Absolute: 0.6 K/uL (ref 0.1–1.0)
Monocytes Relative: 8 % (ref 3–12)
Neutro Abs: 3.9 K/uL (ref 1.7–7.7)
Neutrophils Relative %: 54 % (ref 43–77)
Platelets: 219 K/uL (ref 150–400)
RBC: 4.88 MIL/uL (ref 4.22–5.81)
RDW: 12.9 % (ref 11.5–15.5)
WBC: 7.3 K/uL (ref 4.0–10.5)

## 2015-03-06 LAB — COMPREHENSIVE METABOLIC PANEL
ALBUMIN: 4.4 g/dL (ref 3.5–5.2)
ALK PHOS: 39 U/L (ref 39–117)
ALT: 15 U/L (ref 0–53)
AST: 19 U/L (ref 0–37)
BUN: 13 mg/dL (ref 6–23)
CO2: 22 mEq/L (ref 19–32)
Calcium: 9.6 mg/dL (ref 8.4–10.5)
Chloride: 105 mEq/L (ref 96–112)
Creat: 1.03 mg/dL (ref 0.50–1.35)
Glucose, Bld: 85 mg/dL (ref 70–99)
POTASSIUM: 4.2 meq/L (ref 3.5–5.3)
Sodium: 138 mEq/L (ref 135–145)
Total Bilirubin: 1.6 mg/dL — ABNORMAL HIGH (ref 0.2–1.2)
Total Protein: 6.8 g/dL (ref 6.0–8.3)

## 2015-03-06 LAB — LIPID PANEL
Cholesterol: 141 mg/dL (ref 0–200)
HDL: 73 mg/dL (ref >=40)
LDL Cholesterol: 57 mg/dL (ref 0–99)
Total CHOL/HDL Ratio: 1.9 Ratio
Triglycerides: 55 mg/dL (ref <150)
VLDL: 11 mg/dL (ref 0–40)

## 2015-03-06 LAB — POCT URINALYSIS DIPSTICK
Bilirubin, UA: NEGATIVE
Blood, UA: NEGATIVE
Glucose, UA: NEGATIVE
KETONES UA: NEGATIVE
Leukocytes, UA: NEGATIVE
Nitrite, UA: NEGATIVE
PH UA: 7.5
Protein, UA: NEGATIVE
Spec Grav, UA: 1.02
Urobilinogen, UA: 2

## 2015-03-06 MED ORDER — FLUTICASONE-SALMETEROL 250-50 MCG/DOSE IN AEPB: 1.0000 | INHALATION_SPRAY | Freq: Two times a day (BID) | RESPIRATORY_TRACT | Status: DC

## 2015-03-06 MED ORDER — ALBUTEROL SULFATE HFA 108 (90 BASE) MCG/ACT IN AERS: 2.0000 | INHALATION_SPRAY | RESPIRATORY_TRACT | Status: DC | PRN

## 2015-03-06 NOTE — Progress Notes (Signed)
[facility] [address], [place] [id number] [phone]  Date:  03/06/2015   Name:  Mark IshiharaDeboris D Leon   DOB:  22-Apr-1986   MRN:  [id number]  PCP:  No primary care provider on file.    Chief Complaint: Annual Exam and Medication Refill   History of Present Illness:  Mark Leon is a 29 y.o. very pleasant male patient who presents with the following:  History of asthma and has used his rescue inhaler frequently with allergy season. Out of medication. Has not required medical treatment in office or hospital Smokes No improvement with over the counter medications or other home remedies.  Denies other complaint or health concern today.   There are no active problems to display for this patient.   Past Medical History  Diagnosis Date  . Asthma   . Allergy     Past Surgical History  Procedure Laterality Date  . Shoulder surgery Left     History  Substance Use Topics  . Smoking status: Current Every Day Smoker    Types: Cigarettes  . Smokeless tobacco: Not on file  . Alcohol Use: Yes    History reviewed. No pertinent family history.  No Known Allergies  Medication list has been reviewed and updated.  Current Outpatient Prescriptions on File Prior to Visit  Medication Sig Dispense Refill  . albuterol (PROVENTIL HFA;VENTOLIN HFA) 108 (90 BASE) MCG/ACT inhaler Inhale 2 puffs into the lungs every 6 (six) hours as needed for wheezing.    Marland Kitchen. guaiFENesin (MUCINEX) 600 MG 12 hr tablet Take 600 mg by mouth 2 (two) times daily as needed for congestion.    Marland Kitchen. oseltamivir (TAMIFLU) 75 MG capsule Take 1 capsule (75 mg total) by mouth 2 (two) times daily. (Patient not taking: Reported on 03/06/2015) 10 capsule 0  . predniSONE (DELTASONE) 10 MG tablet Take 5 tab day 1, take 4 tab day 2, take 3 tab day 3, take 2 tab day 4, and take 1 tab day 5 (Patient not taking: Reported on 03/06/2015) 15 tablet 0   No current facility-administered medications on file  prior to visit.    Review of Systems:  As per HPI, otherwise negative.    Physical Examination: Filed Vitals:   03/06/15 1444  BP: 122/78  Pulse: 83  Temp: 98.4 F (36.9 C)  Resp: 20   Filed Vitals:   03/06/15 1444  Height: 5\' 10"  (1.778 m)  Weight: 242 lb (109.77 kg)   Body mass index is 34.72 kg/(m^2). Ideal Body Weight: Weight in (lb) to have BMI = 25: 173.9  GEN: obese, NAD, Non-toxic, A & O x 3 HEENT: Atraumatic, Normocephalic. Neck supple. No masses, No LAD. Ears and Nose: No external deformity. CV: RRR, No M/G/R. No JVD. No thrill. No extra heart sounds. PULM: CTA B, no wheezes, crackles, rhonchi. No retractions. No resp. distress. No accessory muscle use. ABD: S, NT, ND, +BS. No rebound. No HSM. EXTR: No c/c/e NEURO Normal gait.  PSYCH: Normally interactive. Conversant. Not depressed or anxious appearing.  Calm demeanor.    Assessment and Plan: Annual examination Asthma Refills Smoker Advised to quit Labs pending Signed,  Phillips OdorJeffery Verneice Caspers, MD

## 2015-03-06 NOTE — Patient Instructions (Signed)
Smoking Cessation Quitting smoking is important to your health and has many advantages. However, it is not always easy to quit since nicotine is a very addictive drug. Oftentimes, people try 3 times or more before being able to quit. This document explains the best ways for you to prepare to quit smoking. Quitting takes hard work and a lot of effort, but you can do it. ADVANTAGES OF QUITTING SMOKING  You will live longer, feel better, and live better.  Your body will feel the impact of quitting smoking almost immediately.  Within 20 minutes, blood pressure decreases. Your pulse returns to its normal level.  After 8 hours, carbon monoxide levels in the blood return to normal. Your oxygen level increases.  After 24 hours, the chance of having a heart attack starts to decrease. Your breath, hair, and body stop smelling like smoke.  After 48 hours, damaged nerve endings begin to recover. Your sense of taste and smell improve.  After 72 hours, the body is virtually free of nicotine. Your bronchial tubes relax and breathing becomes easier.  After 2 to 12 weeks, lungs can hold more air. Exercise becomes easier and circulation improves.  The risk of having a heart attack, stroke, cancer, or lung disease is greatly reduced.  After 1 year, the risk of coronary heart disease is cut in half.  After 5 years, the risk of stroke falls to the same as a nonsmoker.  After 10 years, the risk of lung cancer is cut in half and the risk of other cancers decreases significantly.  After 15 years, the risk of coronary heart disease drops, usually to the level of a nonsmoker.  If you are pregnant, quitting smoking will improve your chances of having a healthy baby.  The people you live with, especially any children, will be healthier.  You will have extra money to spend on things other than cigarettes. QUESTIONS TO THINK ABOUT BEFORE ATTEMPTING TO QUIT You may want to talk about your answers with your  health care provider.  Why do you want to quit?  If you tried to quit in the past, what helped and what did not?  What will be the most difficult situations for you after you quit? How will you plan to handle them?  Who can help you through the tough times? Your family? Friends? A health care provider?  What pleasures do you get from smoking? What ways can you still get pleasure if you quit? Here are some questions to ask your health care provider:  How can you help me to be successful at quitting?  What medicine do you think would be best for me and how should I take it?  What should I do if I need more help?  What is smoking withdrawal like? How can I get information on withdrawal? GET READY  Set a quit date.  Change your environment by getting rid of all cigarettes, ashtrays, matches, and lighters in your home, car, or work. Do not let people smoke in your home.  Review your past attempts to quit. Think about what worked and what did not. GET SUPPORT AND ENCOURAGEMENT You have a better chance of being successful if you have help. You can get support in many ways.  Tell your family, friends, and coworkers that you are going to quit and need their support. Ask them not to smoke around you.  Get individual, group, or telephone counseling and support. Programs are available at local hospitals and health centers. Call   your local health department for information about programs in your area.  Spiritual beliefs and practices may help some smokers quit.  Download a "quit meter" on your computer to keep track of quit statistics, such as how long you have gone without smoking, cigarettes not smoked, and money saved.  Get a self-help book about quitting smoking and staying off tobacco. LEARN NEW SKILLS AND BEHAVIORS  Distract yourself from urges to smoke. Talk to someone, go for a walk, or occupy your time with a task.  Change your normal routine. Take a different route to work.  Drink tea instead of coffee. Eat breakfast in a different place.  Reduce your stress. Take a hot bath, exercise, or read a book.  Plan something enjoyable to do every day. Reward yourself for not smoking.  Explore interactive web-based programs that specialize in helping you quit. GET MEDICINE AND USE IT CORRECTLY Medicines can help you stop smoking and decrease the urge to smoke. Combining medicine with the above behavioral methods and support can greatly increase your chances of successfully quitting smoking.  Nicotine replacement therapy helps deliver nicotine to your body without the negative effects and risks of smoking. Nicotine replacement therapy includes nicotine gum, lozenges, inhalers, nasal sprays, and skin patches. Some may be available over-the-counter and others require a prescription.  Antidepressant medicine helps people abstain from smoking, but how this works is unknown. This medicine is available by prescription.  Nicotinic receptor partial agonist medicine simulates the effect of nicotine in your brain. This medicine is available by prescription. Ask your health care provider for advice about which medicines to use and how to use them based on your health history. Your health care provider will tell you what side effects to look out for if you choose to be on a medicine or therapy. Carefully read the information on the package. Do not use any other product containing nicotine while using a nicotine replacement product.  RELAPSE OR DIFFICULT SITUATIONS Most relapses occur within the first 3 months after quitting. Do not be discouraged if you start smoking again. Remember, most people try several times before finally quitting. You may have symptoms of withdrawal because your body is used to nicotine. You may crave cigarettes, be irritable, feel very hungry, cough often, get headaches, or have difficulty concentrating. The withdrawal symptoms are only temporary. They are strongest  when you first quit, but they will go away within 10-14 days. To reduce the chances of relapse, try to:  Avoid drinking alcohol. Drinking lowers your chances of successfully quitting.  Reduce the amount of caffeine you consume. Once you quit smoking, the amount of caffeine in your body increases and can give you symptoms, such as a rapid heartbeat, sweating, and anxiety.  Avoid smokers because they can make you want to smoke.  Do not let weight gain distract you. Many smokers will gain weight when they quit, usually less than 10 pounds. Eat a healthy diet and stay active. You can always lose the weight gained after you quit.  Find ways to improve your mood other than smoking. FOR MORE INFORMATION  www.smokefree.gov  Document Released: 10/27/2001 Document Revised: 03/19/2014 Document Reviewed: 02/11/2012 ExitCare Patient Information 2015 [employer]. This information is not intended to replace advice given to you by your health care provider. Make sure you discuss any questions you have with your health care provider.  

## 2016-03-25 ENCOUNTER — Other Ambulatory Visit: Payer: Self-pay

## 2016-03-25 MED ORDER — ALBUTEROL SULFATE HFA 108 (90 BASE) MCG/ACT IN AERS: 2.0000 | INHALATION_SPRAY | RESPIRATORY_TRACT | Status: DC | PRN

## 2016-05-21 ENCOUNTER — Other Ambulatory Visit: Payer: Self-pay | Admitting: Urgent Care

## 2016-06-09 ENCOUNTER — Ambulatory Visit (INDEPENDENT_AMBULATORY_CARE_PROVIDER_SITE_OTHER): Payer: BLUE CROSS/BLUE SHIELD | Admitting: Urgent Care

## 2016-06-09 VITALS — BP 126/86 | HR 80 | Temp 98.6°F | Resp 18 | Ht 69.5 in | Wt 260.4 lb

## 2016-06-09 DIAGNOSIS — Z0000 Encounter for general adult medical examination without abnormal findings: Secondary | ICD-10-CM

## 2016-06-09 DIAGNOSIS — Z113 Encounter for screening for infections with a predominantly sexual mode of transmission: Secondary | ICD-10-CM

## 2016-06-09 DIAGNOSIS — Z23 Encounter for immunization: Secondary | ICD-10-CM

## 2016-06-09 DIAGNOSIS — J4540 Moderate persistent asthma, uncomplicated: Secondary | ICD-10-CM

## 2016-06-09 DIAGNOSIS — F17200 Nicotine dependence, unspecified, uncomplicated: Secondary | ICD-10-CM | POA: Diagnosis not present

## 2016-06-09 DIAGNOSIS — J302 Other seasonal allergic rhinitis: Secondary | ICD-10-CM

## 2016-06-09 LAB — CBC
HCT: 44 % (ref 38.5–50.0)
Hemoglobin: 14.9 g/dL (ref 13.2–17.1)
MCH: 31.5 pg (ref 27.0–33.0)
MCHC: 33.9 g/dL (ref 32.0–36.0)
MCV: 93 fL (ref 80.0–100.0)
MPV: 11.3 fL (ref 7.5–12.5)
PLATELETS: 212 10*3/uL (ref 140–400)
RBC: 4.73 MIL/uL (ref 4.20–5.80)
RDW: 12.5 % (ref 11.0–15.0)
WBC: 7.3 10*3/uL (ref 3.8–10.8)

## 2016-06-09 LAB — LIPID PANEL
CHOLESTEROL: 151 mg/dL (ref 125–200)
HDL: 61 mg/dL (ref >=40)
LDL CALC: 78 mg/dL (ref <130)
Total CHOL/HDL Ratio: 2.5 Ratio (ref <=5.0)
Triglycerides: 58 mg/dL (ref <150)
VLDL: 12 mg/dL (ref <30)

## 2016-06-09 LAB — COMPREHENSIVE METABOLIC PANEL
ALT: 13 U/L (ref 9–46)
AST: 18 U/L (ref 10–40)
Albumin: 4.5 g/dL (ref 3.6–5.1)
Alkaline Phosphatase: 48 U/L (ref 40–115)
BUN: 16 mg/dL (ref 7–25)
CO2: 24 mmol/L (ref 20–31)
CREATININE: 1.15 mg/dL (ref 0.60–1.35)
Calcium: 9.6 mg/dL (ref 8.6–10.3)
Chloride: 106 mmol/L (ref 98–110)
Glucose, Bld: 104 mg/dL — ABNORMAL HIGH (ref 65–99)
Potassium: 4.9 mmol/L (ref 3.5–5.3)
SODIUM: 140 mmol/L (ref 135–146)
Total Bilirubin: 0.9 mg/dL (ref 0.2–1.2)
Total Protein: 6.8 g/dL (ref 6.1–8.1)

## 2016-06-09 LAB — HIV ANTIBODY (ROUTINE TESTING W REFLEX): HIV 1&2 Ab, 4th Generation: NONREACTIVE

## 2016-06-09 LAB — TSH: TSH: 0.93 mIU/L (ref 0.40–4.50)

## 2016-06-09 MED ORDER — ALBUTEROL SULFATE HFA 108 (90 BASE) MCG/ACT IN AERS
2.0000 | INHALATION_SPRAY | Freq: Four times a day (QID) | RESPIRATORY_TRACT | 11 refills | Status: DC | PRN
Start: 2016-06-09 — End: 2017-05-03

## 2016-06-09 NOTE — Patient Instructions (Addendum)

## 2016-06-09 NOTE — Progress Notes (Signed)
MRN: [id number]  Subjective:   [name] is a 30 y.o. male presenting for annual physical exam.  Medical care team includes: PCP: No primary care provider on file. Vision: No visual deficits,  Dental: Does not get dental cleanings consistently, had 5 tooth extractions in the past year. Specialists: None.    Currently lives with his girlfriend, has good relationships. Works in [employer]. Smokes 1/2 ppd, currently trying to quit, cutting back. He is not interested in using medications at the moment. Drinks 1-2 beers per day, does go weeks without drinking as well.   Asthma - managed with albuterol use once daily ~4-5 times per week. ROS as below. He will let me know if he needs help quitting smoking. He also does not take any medications for allergies.  [name] does not have any active problems on his problem list.   [name] has a current medication list which includes the following prescription(s): albuterol. He has No Known Allergies.  [name]  has a past medical history of Allergy and Asthma. Also  has a past surgical history that includes Shoulder surgery (Left).  Maternal grandmother has a history of heart disease. Otherwise, denies family history of cancer, diabetes, HTN, HL, stroke, mental illness.   Immunizations: Need for tdap.  Review of Systems  Constitutional: Negative for chills, diaphoresis, fever, malaise/fatigue and weight loss.  HENT: Negative for congestion, ear discharge, ear pain, hearing loss, nosebleeds, sore throat and tinnitus.   Eyes: Negative for blurred vision, double vision, photophobia, pain, discharge and redness.  Respiratory: Positive for shortness of breath (every other day) and wheezing (every other day). Negative for cough.   Cardiovascular: Negative for chest pain, palpitations and leg swelling.  Gastrointestinal: Negative for abdominal pain, blood in stool, constipation, diarrhea, nausea and vomiting.  Genitourinary: Negative for  dysuria, flank pain, frequency, hematuria and urgency.  Musculoskeletal: Negative for back pain, joint pain and myalgias.  Skin: Negative for itching and rash.  Neurological: Negative for dizziness, tingling, seizures, loss of consciousness, weakness and headaches.  Endo/Heme/Allergies: Negative for polydipsia.  Psychiatric/Behavioral: Negative for depression, hallucinations, memory loss, substance abuse and suicidal ideas. The patient is not nervous/anxious and does not have insomnia.      Objective:   Vitals: BP 126/86 (BP Location: Right Arm, Patient Position: Sitting, Cuff Size: Large)   Pulse 80   Temp 98.6 F (37 C) (Oral)   Resp 18   Ht 5' 9.5" (1.765 m)   Wt 260 lb 6.4 oz (118.1 kg)   SpO2 96%   BMI 37.90 kg/m    Visual Acuity Screening   Right eye Left eye Both eyes  Without correction:  With correction:       Physical Exam  Constitutional: He is oriented to person, place, and time. He appears well-developed and well-nourished.  HENT:  Right Ear: Hearing, tympanic membrane and ear canal normal.  Left Ear: Hearing, tympanic membrane and ear canal normal.  Mouth/Throat: Uvula is midline and mucous membranes are normal.  TM's intact bilaterally, no effusions or erythema. Nasal turbinates pink and moist, nasal passages patent. No sinus tenderness. Oropharynx clear, mucous membranes moist, dentition in good repair.  Eyes: Conjunctivae, EOM and lids are normal. Pupils are equal, round, and reactive to light. Right eye exhibits no discharge. Left eye exhibits no discharge. No scleral icterus.  Neck: Trachea normal and normal range of motion. Neck supple. Carotid bruit is not present. No thyromegaly present.  Cardiovascular: Normal rate, regular rhythm,  normal heart sounds, intact distal pulses and normal pulses.  Exam reveals no gallop and no friction rub.   No murmur heard. Pulmonary/Chest: Effort normal and breath sounds normal. No stridor. No respiratory  distress. He has no wheezes. He has no rhonchi. He has no rales.  Abdominal: Soft. Normal appearance and bowel sounds are normal. He exhibits no distension, no abdominal bruit and no mass. There is no tenderness.  Musculoskeletal: Normal range of motion. He exhibits no edema or tenderness.  Lymphadenopathy:       Head (right side): No submental, no submandibular, no tonsillar, no preauricular, no posterior auricular and no occipital adenopathy present.       Head (left side): No submental, no submandibular, no tonsillar, no preauricular, no posterior auricular and no occipital adenopathy present.    He has no cervical adenopathy.  Neurological: He is alert and oriented to person, place, and time. He has normal strength and normal reflexes. No sensory deficit. Coordination and gait normal.  Skin: Skin is warm, dry and intact. No lesion and no rash noted. No erythema. No pallor.  Psychiatric: He has a normal mood and affect. His speech is normal and behavior is normal. Judgment and thought content normal.   Assessment and Plan :   1. Annual physical exam - Patient is medically healthy, labs pending. Discussed healthy lifestyle, diet, exercise, preventative care, vaccinations, and addressed patient's concerns.    2. Screen for STD (sexually transmitted disease) - Labs pending   3. Extrinsic asthma, moderate persistent, uncomplicated - Refilled albuterol inhaler. Counseled on smoking cessation to help patient's asthma.  4. Seasonal allergies - Start Zyrtec.  5. Tobacco use disorder - Patient will let me know if he wants to use Chantix or Wellbutrin  6. Need for Tdap vaccination - Tdap vaccine greater than or equal to 7yo IM   Wallis Bamberg, PA-C [facility] [phone] 06/09/2016  10:14 AM

## 2016-06-10 LAB — GC/CHLAMYDIA PROBE AMP
CT PROBE, AMP APTIMA: NOT DETECTED
GC Probe RNA: NOT DETECTED

## 2016-06-10 LAB — RPR

## 2016-06-11 ENCOUNTER — Encounter: Payer: Self-pay | Admitting: Urgent Care

## 2016-06-16 ENCOUNTER — Ambulatory Visit (INDEPENDENT_AMBULATORY_CARE_PROVIDER_SITE_OTHER): Payer: BLUE CROSS/BLUE SHIELD | Admitting: Physician Assistant

## 2016-06-16 VITALS — BP 121/73 | HR 83 | Temp 98.5°F | Resp 17 | Ht 70.5 in | Wt 263.0 lb

## 2016-06-16 DIAGNOSIS — M545 Low back pain: Secondary | ICD-10-CM | POA: Diagnosis not present

## 2016-06-16 MED ORDER — CYCLOBENZAPRINE HCL 10 MG PO TABS: 5.0000 mg | ORAL_TABLET | Freq: Every day | ORAL | 0 refills | Status: AC

## 2016-06-16 MED ORDER — NAPROXEN 500 MG PO TABS: 500.0000 mg | ORAL_TABLET | Freq: Two times a day (BID) | ORAL | 0 refills | Status: DC

## 2016-06-16 NOTE — Patient Instructions (Addendum)
You exam is reassuring.  This will likely resolve in about three weeks. Please return to clinic if you develop weakness in your legs or lose control of your bowel or bladder.     IF you received an x-ray today, you will receive an invoice from [facility]. Please contact [facility] at [phone] with questions or concerns regarding your invoice.   IF you received labwork today, you will receive an invoice from [employer]. Please contact [facility] at [phone] with questions or concerns regarding your invoice.   Our billing staff will not be able to assist you with questions regarding bills from these companies.  You will be contacted with the lab results as soon as they are available. The fastest way to get your results is to activate your My Chart account. Instructions are located on the last page of this paperwork. If you have not heard from [place] regarding the results in 2 weeks, please contact this office.

## 2016-06-16 NOTE — Progress Notes (Signed)
06/16/2016 2:03 PM   DOB: [birth year] / MRN: [id number]  SUBJECTIVE:  Mark Leon is a 30 y.o. male presenting for low back pain that started yesterday evening.  States the pain is dull and achy, and is located about the bilateral spine about the sacrum.  He denies fever, chills, nausea and incontinence.  The pain is made worse by going from sitting to standing.   Depression screen PHQ 2/9 06/16/2016  Decreased Interest 0  Down, Depressed, Hopeless 0  PHQ - 2 Score 0   He has No Known Allergies.   He  has a past medical history of Allergy and Asthma.    He  reports that he has been smoking Cigarettes.  He has a 8.50 pack-year smoking history. He has never used smokeless tobacco. He reports that he drinks alcohol. He reports that he does not use drugs. He  reports that he does not engage in sexual activity. The patient  has a past surgical history that includes Shoulder surgery (Left).  His family history is not on file.  Review of Systems  Constitutional: Negative for diaphoresis and fever.  Gastrointestinal: Negative for nausea.  Genitourinary: Negative for dysuria.  Neurological: Negative for dizziness, tingling, tremors, sensory change, focal weakness and headaches.    The problem list and medications were reviewed and updated by myself where necessary and exist elsewhere in the encounter.   OBJECTIVE:  BP 121/73 (BP Location: Right Arm, Patient Position: Sitting, Cuff Size: Normal)   Pulse 83   Temp 98.5 F (36.9 C) (Oral)   Resp 17   Ht 5' 10.5" (1.791 m)   Wt 263 lb (119.3 kg)   SpO2 98%   BMI 37.20 kg/m   Physical Exam  Constitutional: He is oriented to person, place, and time. He appears well-developed. He does not appear ill.  Eyes: Conjunctivae and EOM are normal. Pupils are equal, round, and reactive to light.  Cardiovascular: Normal rate.   Pulmonary/Chest: Effort normal.  Abdominal: He exhibits no distension.  Musculoskeletal: Normal range of motion.    Neurological: He is alert and oriented to person, place, and time. He has normal strength. He displays no atrophy and no tremor. No cranial nerve deficit or sensory deficit. He exhibits normal muscle tone. He displays a negative Romberg sign. He displays no seizure activity. Coordination and gait normal.  Reflex Scores:      Patellar reflexes are 2+ on the right side and 2+ on the left side.      Achilles reflexes are 2+ on the right side and 2+ on the left side. Skin: Skin is warm and dry. He is not diaphoretic.  Psychiatric: He has a normal mood and affect.  Nursing note and vitals reviewed.  Lab Results  Component Value Date   CREATININE 1.15 06/09/2016     No results found for this or any previous visit (from the past 72 hour(s)).  No results found.  ASSESSMENT AND PLAN  Mark Leon was seen today for back pain.  Diagnoses and all orders for this visit:  Midline low back pain, with sciatica presence unspecified: Exam and HPI reassuring.  Advised NSAID and muscle relaxer.  Will see him back in about 3 weeks for imaging if he is not improving.  He is to RTC if he develops weakness or bowel or bladder incontinence.  -     naproxen (NAPROSYN) 500 MG tablet; Take 1 tablet (500 mg total) by mouth 2 (two) times daily with a meal. -  cyclobenzaprine (FLEXERIL) 10 MG tablet; Take 0.5-1 tablets (5-10 mg total) by mouth at bedtime.    The patient is advised to call or return to clinic if he does not see an improvement in symptoms, or to seek the care of the closest emergency department if he worsens with the above plan.   Deliah Boston, MHS, PA-C [facility] 06/16/2016 2:03 PM

## 2017-05-03 ENCOUNTER — Other Ambulatory Visit: Payer: Self-pay | Admitting: Urgent Care

## 2017-06-09 ENCOUNTER — Ambulatory Visit (INDEPENDENT_AMBULATORY_CARE_PROVIDER_SITE_OTHER): Payer: BLUE CROSS/BLUE SHIELD | Admitting: Urgent Care

## 2017-06-09 ENCOUNTER — Encounter: Payer: Self-pay | Admitting: Urgent Care

## 2017-06-09 VITALS — BP 133/78 | HR 79 | Temp 98.5°F | Resp 18 | Ht 70.5 in | Wt 269.4 lb

## 2017-06-09 DIAGNOSIS — M546 Pain in thoracic spine: Secondary | ICD-10-CM

## 2017-06-09 DIAGNOSIS — J4540 Moderate persistent asthma, uncomplicated: Secondary | ICD-10-CM

## 2017-06-09 DIAGNOSIS — Z0000 Encounter for general adult medical examination without abnormal findings: Secondary | ICD-10-CM | POA: Diagnosis not present

## 2017-06-09 DIAGNOSIS — F17210 Nicotine dependence, cigarettes, uncomplicated: Secondary | ICD-10-CM

## 2017-06-09 DIAGNOSIS — E669 Obesity, unspecified: Secondary | ICD-10-CM

## 2017-06-09 DIAGNOSIS — F17200 Nicotine dependence, unspecified, uncomplicated: Secondary | ICD-10-CM

## 2017-06-09 DIAGNOSIS — Z6838 Body mass index (BMI) 38.0-38.9, adult: Secondary | ICD-10-CM

## 2017-06-09 MED ORDER — ORLISTAT 120 MG PO CAPS: 120.0000 mg | ORAL_CAPSULE | Freq: Three times a day (TID) | ORAL | 1 refills | Status: AC

## 2017-06-09 MED ORDER — ALBUTEROL SULFATE HFA 108 (90 BASE) MCG/ACT IN AERS: 2.0000 | INHALATION_SPRAY | Freq: Four times a day (QID) | RESPIRATORY_TRACT | 1 refills | Status: DC | PRN

## 2017-06-09 MED ORDER — BUPROPION HCL ER (XL) 150 MG PO TB24: ORAL_TABLET | ORAL | 2 refills | Status: AC

## 2017-06-09 NOTE — Progress Notes (Signed)
MRN: [id number]  Subjective:   [name] is a 31 y.o. male presenting for annual physical exam, weight loss and smoking. Patient is currently engaged, has 1 son outside of his engagement. Has good relationships at home, has a good support network. Works in [employer] and reports that he is constantly on the clock but denies working third shift. Smokes 1 ppd for the past 8 years. Has tried to quit, has used gum before but would prefer to try medication. Has ~2 drinks of alcohol per day.  Medical care team includes: PCP: Patient, No Pcp Per Vision: No visual deficits. Dental: Dental cleanings every 6 months.  Specialists: None.  Weight loss - patient reports that he keeps 1800 calorie count. He stays active and exercises regularly. He would like to have a weight loss medication. States that his mother started phentermine.    Patient has concerns that there is mold in his house, reports that he gets spasms over his left lateral back ~2 times per month and has to stretch out. He also has nasal congestion. His thought is that his finacee has the exact same symptoms and they are worried the issue could be mold but has not had anyone officially check the home. Has a history of asthma, uses his inhaler once daily and is requesting a refill.   Mark Leon  does not have a problem list on file.  Mark Leon has a current medication list which includes the following prescription(s): cyclobenzaprine. He has No Known Allergies. Mark Leon  has a past medical history of Allergy and Asthma. Also  has a past surgical history that includes Shoulder surgery (Left).  Family history of heart disease with maternal grandmother, she passed from heart failure.  Immunizations: Tdap 06/09/2016  Review of Systems  Constitutional: Negative for chills, diaphoresis, fever, malaise/fatigue and weight loss.  HENT: Positive for congestion. Negative for ear discharge, ear pain, hearing loss, nosebleeds, sore throat and  tinnitus.   Eyes: Negative for blurred vision, double vision, photophobia, pain, discharge and redness.  Respiratory: Positive for wheezing. Negative for cough and shortness of breath.   Cardiovascular: Negative for chest pain, palpitations and leg swelling.  Gastrointestinal: Negative for abdominal pain, blood in stool, constipation, diarrhea, nausea and vomiting.  Genitourinary: Negative for dysuria, flank pain, frequency, hematuria and urgency.  Musculoskeletal: Positive for back pain (left lateral back). Negative for joint pain and myalgias.  Skin: Negative for itching and rash.  Neurological: Negative for dizziness, tingling, seizures, loss of consciousness, weakness and headaches.  Endo/Heme/Allergies: Negative for polydipsia.  Psychiatric/Behavioral: Negative for depression, hallucinations, memory loss, substance abuse and suicidal ideas. The patient is not nervous/anxious and does not have insomnia.    Objective:   Vitals: BP 133/78   Pulse 79   Temp 98.5 F (36.9 C) (Oral)   Resp 18   Ht 5' 10.5" (1.791 m)   Wt 269 lb 6.4 oz (122.2 kg)   SpO2 96%   BMI 38.11 kg/m   BP Readings from Last 3 Encounters:  06/09/17 133/78  06/16/16 121/73  06/09/16 126/86   Wt Readings from Last 3 Encounters:  06/09/17 269 lb 6.4 oz (122.2 kg)  06/16/16 263 lb (119.3 kg)  06/09/16 260 lb 6.4 oz (118.1 kg)   Physical Exam  Constitutional: He is oriented to person, place, and time. He appears well-developed and well-nourished.  HENT:  TM's intact bilaterally, no effusions or erythema. Nasal turbinates pink and moist, nasal passages patent. No sinus tenderness. Oropharynx clear, mucous membranes moist,  dentition in good repair.  Eyes: Pupils are equal, round, and reactive to light. Conjunctivae and EOM are normal. Right eye exhibits no discharge. Left eye exhibits no discharge. No scleral icterus.  Neck: Normal range of motion. Neck supple. No thyromegaly present.  Cardiovascular: Normal  rate, regular rhythm and intact distal pulses.  Exam reveals no gallop and no friction rub.   No murmur heard. Pulmonary/Chest: No stridor. No respiratory distress. He has no wheezes. He has no rales.  Abdominal: Soft. Bowel sounds are normal. He exhibits no distension and no mass. There is no tenderness.  Musculoskeletal: Normal range of motion. He exhibits no edema or tenderness.  Lymphadenopathy:    He has no cervical adenopathy.  Neurological: He is alert and oriented to person, place, and time. He has normal reflexes. He displays normal reflexes.  Skin: Skin is warm and dry. Capillary refill takes less than 2 seconds. No rash noted. No erythema. No pallor.  Psychiatric: He has a normal mood and affect.   Assessment and Plan :   1. Annual physical exam - Medically stable, labs pending. Discussed healthy lifestyle, diet, exercise, preventative care, vaccinations, and addressed patient's concerns.  - Lipid panel - Comprehensive metabolic panel - TSH - CBC  2. Class 2 obesity without serious comorbidity with body mass index (BMI) of 38.0 to 38.9 in adult, unspecified obesity type - Counseled extensively on lifestyle modifications and weight loss. I let patient know that these medications are not typically covered and may need a prior authorization. He may end up paying out of pocket. Follow up in 3 months. - orlistat (XENICAL) 120 MG capsule; Take 1 capsule (120 mg total) by mouth 3 (three) times daily with meals.  Dispense: 90 capsule; Refill: 1  3. Tobacco use disorder - Counseled on smoking cessation including options for topical and medical therapy. Patient opted for Wellbutrin. Counseled patient on potential for adverse effects with medications prescribed today, patient verbalized understanding. Follow up in 3 months. - buPROPion (WELLBUTRIN XL) 150 MG 24 hr tablet; Start with 1 tablet daily for 3 days. Then increase to 1 tablet twice daily thereafter.  Dispense: 60 tablet; Refill:  2  4. Left-sided thoracic back pain, unspecified chronicity - Unclear etiology, patient declined work up and muscle relaxant since he can relieve the problem by stretching. I recommended patient have someone formally check the home for mold but I do not suspect this is related.  5. Moderate persistent asthma without complication - I provided patient with temporary refill but unfortunately could not address his asthma in full given work up as above. I requested patient set up a follow up appointment to hone in on his asthma. We are starting smoking cessation as above and my hope is this will help. Patient to set up a follow up appointment. - albuterol (PROAIR HFA) 108 (90 Base) MCG/ACT inhaler; Inhale 2 puffs into the lungs every 6 (six) hours as needed for wheezing or shortness of breath.  Dispense: 18 each; Refill: 1   Wallis BambergMario Yani Coventry, PA-C [facility] [phone] 06/09/2017  9:55 AM

## 2017-06-09 NOTE — Patient Instructions (Addendum)
Salads - kale, spinach, cabbage, spring mix; use seeds like pumpkin seeds or sunflower seeds; you can also use 1-2 hard boiled eggs. Fruits - avocadoes, berries (blueberries, raspberries, blackberries), apples, oranges, pomegranate, grapefruit Seeds - quinoa, chia seeds; you can also incorporate oatmeal Vegetables - aspargus, cauliflower, broccoli, green beans, brussel spouts, bell peppers; stay away from starchy vegetables like potatoes, carrots, peas  Brussel sprouts - Cut off stems. Place in a mixing bowl that has a lid. Pour in a 1/4-1/2 cup olive oil, spices, use a light amount of parmesan. Place on a baking sheet. Bake for 10 minutes at 400F. Take it out, eat the brussel chips. Place for another 5-10 minutes.   Mashed cauliflower - Boil a bunch of cauliflower in a pot of water. Blend in a food processor with 1-2 tablespoons of butter.  Spaghetti squash -  Cut the squash in half very carefully, clean out seeds from the middle. Place 1/2 face down in a microwave safe dish with at least 2 inches of water. Make 4-6 slits on outside of spaghetti squash and microwave for 10-12 minutes. Take out the spaghetti using a metal spoon. Repeat for the other half.   Vega protein is good protein powder, make sure you use ~6 ice cubes to give it smoothie consistency together with ~4-6 ounces of vanilla soy milk. Throw cinnamon into your shake, use peanut butter. You can also use the fruits that I listed above. Throw spinach or kale into the shake.     Bupropion sustained-release tablets (smoking cessation) What is this medicine? BUPROPION (byoo PROE pee on) is used to help people quit smoking. This medicine may be used for other purposes; ask your health care provider or pharmacist if you have questions. COMMON BRAND NAME(S): Buproban, Zyban What should I tell my health care provider before I take this medicine? They need to know if you have any of these conditions: -an eating disorder, such as anorexia  or bulimia -bipolar disorder or psychosis -diabetes or high blood sugar, treated with medication -glaucoma -head injury or brain tumor -heart disease, previous heart attack, or irregular heart beat -high blood pressure -kidney or liver disease -seizures -suicidal thoughts or a previous suicide attempt -Tourette's syndrome -weight loss -an unusual or allergic reaction to bupropion, other medicines, foods, dyes, or preservatives -breast-feeding -pregnant or trying to become pregnant How should I use this medicine? Take this medicine by mouth with a glass of water. Follow the directions on the prescription label. You can take it with or without food. If it upsets your stomach, take it with food. Do not cut, crush or chew this medicine. Take your medicine at regular intervals. If you take this medicine more than once a day, take your second dose at least 8 hours after you take your first dose. To limit difficulty in sleeping, avoid taking this medicine at bedtime. Do not take your medicine more often than directed. Do not stop taking this medicine suddenly except upon the advice of your doctor. Stopping this medicine too quickly may cause serious side effects. A special MedGuide will be given to you by the pharmacist with each prescription and refill. Be sure to read this information carefully each time. Talk to your pediatrician regarding the use of this medicine in children. Special care may be needed. Overdosage: If you think you have taken too much of this medicine contact a poison control center or emergency room at once. NOTE: This medicine is only for you. Do not share this  medicine with others. What if I miss a dose? If you miss a dose, skip the missed dose and take your next tablet at the regular time. There should be at least 8 hours between doses. Do not take double or extra doses. What may interact with this medicine? Do not take this medicine with any of the following  medications: -linezolid -MAOIs like Azilect, Carbex, Eldepryl, Marplan, Nardil, and Parnate -methylene blue (injected into a vein) -other medicines that contain bupropion like Wellbutrin This medicine may also interact with the following medications: -alcohol -certain medicines for anxiety or sleep -certain medicines for blood pressure like metoprolol, propranolol -certain medicines for depression or psychotic disturbances -certain medicines for HIV or AIDS like efavirenz, lopinavir, nelfinavir, ritonavir -certain medicines for irregular heart beat like propafenone, flecainide -certain medicines for Parkinson's disease like amantadine, levodopa -certain medicines for seizures like carbamazepine, phenytoin, phenobarbital -cimetidine -clopidogrel -cyclophosphamide -digoxin -furazolidone -isoniazid -nicotine -orphenadrine -procarbazine -steroid medicines like prednisone or cortisone -stimulant medicines for attention disorders, weight loss, or to stay awake -tamoxifen -theophylline -thiotepa -ticlopidine -tramadol -warfarin This list may not describe all possible interactions. Give your health care provider a list of all the medicines, herbs, non-prescription drugs, or dietary supplements you use. Also tell them if you smoke, drink alcohol, or use illegal drugs. Some items may interact with your medicine. What should I watch for while using this medicine? Visit your doctor or health care professional for regular checks on your progress. This medicine should be used together with a patient support program. It is important to participate in a behavioral program, counseling, or other support program that is recommended by your health care professional. Patients and their families should watch out for new or worsening thoughts of suicide or depression. Also watch out for sudden changes in feelings such as feeling anxious, agitated, panicky, irritable, hostile, aggressive, impulsive,  severely restless, overly excited and hyperactive, or not being able to sleep. If this happens, especially at the beginning of treatment or after a change in dose, call your health care professional. Avoid alcoholic drinks while taking this medicine. Drinking excessive alcoholic beverages, using sleeping or anxiety medicines, or quickly stopping the use of these agents while taking this medicine may increase your risk for a seizure. Do not drive or use heavy machinery until you know how this medicine affects you. This medicine can impair your ability to perform these tasks. Do not take this medicine close to bedtime. It may prevent you from sleeping. Your mouth may get dry. Chewing sugarless gum or sucking hard candy, and drinking plenty of water may help. Contact your doctor if the problem does not go away or is severe. Do not use nicotine patches or chewing gum without the advice of your doctor or health care professional while taking this medicine. You may need to have your blood pressure taken regularly if your doctor recommends that you use both nicotine and this medicine together. What side effects may I notice from receiving this medicine? Side effects that you should report to your doctor or health care professional as soon as possible: -allergic reactions like skin rash, itching or hives, swelling of the face, lips, or tongue -breathing problems -changes in vision -confusion -elevated mood, decreased need for sleep, racing thoughts, impulsive behavior -fast or irregular heartbeat -hallucinations, loss of contact with reality -increased blood pressure -redness, blistering, peeling or loosening of the skin, including inside the mouth -seizures -suicidal thoughts or other mood changes -unusually weak or tired -vomiting Side effects that  usually do not require medical attention (report to your doctor or health care professional if they continue or are  bothersome): -constipation -headache -loss of appetite -nausea -tremors -weight loss This list may not describe all possible side effects. Call your doctor for medical advice about side effects. You may report side effects to FDA at 1-800-FDA-1088. Where should I keep my medicine? Keep out of the reach of children. Store at room temperature between 20 and 25 degrees C (68 and 77 degrees F). Protect from light. Keep container tightly closed. Throw away any unused medicine after the expiration date. NOTE: This sheet is a summary. It may not cover all possible information. If you have questions about this medicine, talk to your doctor, pharmacist, or health care provider.  2018 Elsevier/Gold Standard (2016-04-24 13:49:28)     Steps to Quit Smoking Smoking tobacco can be bad for your health. It can also affect almost every organ in your body. Smoking puts you and people around you at risk for many serious long-lasting (chronic) diseases. Quitting smoking is hard, but it is one of the best things that you can do for your health. It is never too late to quit. What are the benefits of quitting smoking? When you quit smoking, you lower your risk for getting serious diseases and conditions. They can include:  Lung cancer or lung disease.  Heart disease.  Stroke.  Heart attack.  Not being able to have children (infertility).  Weak bones (osteoporosis) and broken bones (fractures).  If you have coughing, wheezing, and shortness of breath, those symptoms may get better when you quit. You may also get sick less often. If you are pregnant, quitting smoking can help to lower your chances of having a baby of low birth weight. What can I do to help me quit smoking? Talk with your doctor about what can help you quit smoking. Some things you can do (strategies) include:  Quitting smoking totally, instead of slowly cutting back how much you smoke over a period of time.  Going to in-person  counseling. You are more likely to quit if you go to many counseling sessions.  Using resources and support systems, such as: ? Agricultural engineer with a Veterinary surgeon. ? Phone quitlines. ? Automotive engineer. ? Support groups or group counseling. ? Text messaging programs. ? Mobile phone apps or applications.  Taking medicines. Some of these medicines may have nicotine in them. If you are pregnant or breastfeeding, do not take any medicines to quit smoking unless your doctor says it is okay. Talk with your doctor about counseling or other things that can help you.  Talk with your doctor about using more than one strategy at the same time, such as taking medicines while you are also going to in-person counseling. This can help make quitting easier. What things can I do to make it easier to quit? Quitting smoking might feel very hard at first, but there is a lot that you can do to make it easier. Take these steps:  Talk to your family and friends. Ask them to support and encourage you.  Call phone quitlines, reach out to support groups, or work with a Veterinary surgeon.  Ask people who smoke to not smoke around you.  Avoid places that make you want (trigger) to smoke, such as: ? Bars. ? Parties. ? Smoke-break areas at work.  Spend time with people who do not smoke.  Lower the stress in your life. Stress can make you want to smoke. Try  these things to help your stress: ? Getting regular exercise. ? Deep-breathing exercises. ? Yoga. ? Meditating. ? Doing a body scan. To do this, close your eyes, focus on one area of your body at a time from head to toe, and notice which parts of your body are tense. Try to relax the muscles in those areas.  Download or buy apps on your mobile phone or tablet that can help you stick to your quit plan. There are many free apps, such as QuitGuide from the [employer] developer(Centers for Disease Control and Prevention). You can find more support from smokefree.gov and other  websites.  This information is not intended to replace advice given to you by your health care provider. Make sure you discuss any questions you have with your health care provider. Document Released: 08/29/2009 Document Revised: 06/30/2016 Document Reviewed: 03/19/2015 Elsevier Interactive Patient Education  2018 [employer]    Orlistat capsules What is this medicine? Orlistat (OR li stat) is used to help obese people lose weight and keep the weight off while eating a reduced-calorie diet. This medicine decreases the amount of fat that is absorbed from your diet. This medicine may be used for other purposes; ask your health care provider or pharmacist if you have questions. COMMON BRAND NAME(S): alli, Xenical What should I tell my health care provider before I take this medicine? They need to know if you have any of these conditions: -an eating disorder, such as anorexia or bulimia -gallbladder problems or gallstones -HIV or AIDS -kidney stones -liver disease or hepatitis -organ transplant -pancreatitis -problems absorbing food -seizures -stomach or intestine problems -take medicines that treat or prevent blood clots -an unusual or allergic reaction to orlistat, other medicines, foods, dyes, supplements or preservatives -pregnant or trying to get pregnant -breast-feeding How should I use this medicine? Take this medicine by mouth with a glass of water. Follow the directions on the prescription label. Take this medicine with each main meal that contains about 30 percent of the calories from fat or one hour after the meal. Do not take your medicine more often than directed. If you occasionally miss a meal or have a meal without fat, you can skip that dose of this medicine. Talk to your pediatrician regarding the use of this medicine in children. Special care may be needed. While this drug may be prescribed for children as young as 12 years for selected conditions, precautions do  apply. Overdosage: If you think you have taken too much of this medicine contact a poison control center or emergency room at once. NOTE: This medicine is only for you. Do not share this medicine with others. What if I miss a dose? If you miss a dose, take it within one hour following the meal that contains fat. If it is almost time for your next dose, take only that dose. Do not take double or extra doses. What may interact with this medicine? -amiodarone -antiviral medicines for HIV or AIDS -certain medicines for seizures like carbamazepine, phenobarbital, phenytoin -certain medicines that treat or prevent blood clots like warfarin, enoxaparin, dalteparin, apixaban, dabigatran, and rivaroxaban -cyclosporine -diabetes medicines -dietary supplements like beta-carotene and vitamins A, D, E, and K -other weight loss products -thyroid hormones This list may not describe all possible interactions. Give your health care provider a list of all the medicines, herbs, non-prescription drugs, or dietary supplements you use. Also tell them if you smoke, drink alcohol, or use illegal drugs. Some items may interact with your medicine.  What should I watch for while using this medicine? Do not use this medicine if you have had an organ transplant. This medicine interferes with some of the medicines used to prevent transplant rejection. This medicine can cause decreased absorption of some vitamins. You should take a daily multivitamin that contains normal amounts of vitamins D, E, K and beta-carotene or vitamin A. Take the multivitamin once per day at bedtime unless otherwise directed by your doctor or healthcare professional. You should use this medicine with a reduced-calorie diet that contains no more than about 30 percent of the calories from fat. Divide your daily intake of fat, carbohydrates, and protein evenly over your 3 main meals. Follow a well-balanced, reduced-calorie, low fat diet. Try starting this  diet before taking this medicine. Following a low fat diet can help reduce the possible side effects from this medicine. Do not use this medicine if you are pregnant. Losing weight while pregnant is not advised and may cause harm to the unborn child. Talk to your health care professional or pharmacist for more information. What side effects may I notice from receiving this medicine? Side effects that you should report to your doctor or health care professional as soon as possible: -allergic reactions like skin rash, itching or hives, swelling of the face, lips, or tongue -breathing problems -bloody or black, tarry stools -signs of infection like fever or chills -signs and symptoms of kidney stones like blood in the urine; pain in the lower back or side; pain when urinating -signs and symptoms of liver injury like dark yellow or brown urine; general ill feeling or flu-like symptoms; light-colored stools; loss of appetite; nausea; right upper belly pain; unusually weak or tired; yellowing of the eyes or skin -trouble passing urine or change in the amount of urine -uncontrolled, urgent bowel movements -vomiting Side effects that usually do not require medical attention (report to your doctor or health care professional if they continue or are bothersome): -increased number of bowel movements -oily stools (bowel movements may be clear, orange or brown in color) -stomach discomfort, gas This list may not describe all possible side effects. Call your doctor for medical advice about side effects. You may report side effects to FDA at 1-800-FDA-1088. Where should I keep my medicine? Keep out of the reach of children. Storage at room temperature between 15 and 30 degrees C (59 and 86 degrees F). Keep container tightly closed. Throw away any unused medicine after the expiration date. NOTE: This sheet is a summary. It may not cover all possible information. If you have questions about this medicine, talk  to your doctor, pharmacist, or health care provider.  2018 Elsevier/Gold Standard (2015-06-19 16:16:49)     IF you received an x-ray today, you will receive an invoice from [facility]. Please contact [facility] at [phone] with questions or concerns regarding your invoice.   IF you received labwork today, you will receive an invoice from [facility]. Please contact [facility] at [phone] with questions or concerns regarding your invoice.   Our billing staff will not be able to assist you with questions regarding bills from these companies.  You will be contacted with the lab results as soon as they are available. The fastest way to get your results is to activate your My Chart account. Instructions are located on the last page of this paperwork. If you have not heard from us regarding the results in 2 weeks, please contact this office.

## 2017-06-10 LAB — COMPREHENSIVE METABOLIC PANEL
ALK PHOS: 59 IU/L (ref 39–117)
ALT: 20 IU/L (ref 0–44)
AST: 18 IU/L (ref 0–40)
Albumin/Globulin Ratio: 1.8 (ref 1.2–2.2)
Albumin: 4.4 g/dL (ref 3.5–5.5)
BUN/Creatinine Ratio: 11 (ref 9–20)
BUN: 12 mg/dL (ref 6–20)
Bilirubin Total: 0.5 mg/dL (ref 0.0–1.2)
CO2: 19 mmol/L — AB (ref 20–29)
CREATININE: 1.09 mg/dL (ref 0.76–1.27)
Calcium: 9.7 mg/dL (ref 8.7–10.2)
Chloride: 107 mmol/L — ABNORMAL HIGH (ref 96–106)
GFR calc Af Amer: 104 mL/min/{1.73_m2} (ref >59)
GFR calc non Af Amer: 90 mL/min/{1.73_m2} (ref >59)
GLUCOSE: 100 mg/dL — AB (ref 65–99)
Globulin, Total: 2.4 g/dL (ref 1.5–4.5)
Potassium: 4.4 mmol/L (ref 3.5–5.2)
Sodium: 140 mmol/L (ref 134–144)
Total Protein: 6.8 g/dL (ref 6.0–8.5)

## 2017-06-10 LAB — CBC
HEMATOCRIT: 44.7 % (ref 37.5–51.0)
HEMOGLOBIN: 15.2 g/dL (ref 13.0–17.7)
MCH: 31.2 pg (ref 26.6–33.0)
MCHC: 34 g/dL (ref 31.5–35.7)
MCV: 92 fL (ref 79–97)
Platelets: 238 10*3/uL (ref 150–379)
RBC: 4.87 x10E6/uL (ref 4.14–5.80)
RDW: 13 % (ref 12.3–15.4)
WBC: 8 10*3/uL (ref 3.4–10.8)

## 2017-06-10 LAB — TSH: TSH: 1 u[IU]/mL (ref 0.450–4.500)

## 2017-06-10 LAB — LIPID PANEL
CHOL/HDL RATIO: 2.2 ratio (ref 0.0–5.0)
Cholesterol, Total: 122 mg/dL (ref 100–199)
HDL: 55 mg/dL (ref >39)
LDL CALC: 56 mg/dL (ref 0–99)
Triglycerides: 54 mg/dL (ref 0–149)
VLDL Cholesterol Cal: 11 mg/dL (ref 5–40)

## 2017-06-10 NOTE — Addendum Note (Signed)
Addended by: Baldwin CrownJOHNSON, Keghan Mcfarren D on: 06/10/2017 03:05 PM   Modules accepted: Orders

## 2017-06-11 LAB — CK: CK TOTAL: 164 U/L (ref 24–204)

## 2017-07-16 ENCOUNTER — Telehealth: Payer: Self-pay | Admitting: Urgent Care

## 2017-07-16 DIAGNOSIS — J4540 Moderate persistent asthma, uncomplicated: Secondary | ICD-10-CM

## 2017-07-16 NOTE — Telephone Encounter (Signed)
Pt is calling to get a refill on his Proair inhaler.  He is currently out of town and has run out of his prescription.  Prescription can be temporarily called in to the [facility] on [address] in [place].  Pt is very concerned that he might have an attack while he is out.  Please advise if this can be done.  I did advise pt of the refill policy of 2 to 3 business days and that if he starts to have issues, to please go somewhere to be seen.  [phone]

## 2017-07-21 NOTE — Telephone Encounter (Signed)
Patient states that he needs his proair inhaler filled.  [facility] to [facility] [address] as he is no longer in [place]. He says that he usually gets refills for one year, but Marquita PalmsMario did not fill it for him at the last visit.  He thinks it was just an oversight, but would like a year's worth of RF if possible.  Please advise..Marland Kitchen

## 2017-07-22 MED ORDER — ALBUTEROL SULFATE HFA 108 (90 BASE) MCG/ACT IN AERS: 2.0000 | INHALATION_SPRAY | Freq: Four times a day (QID) | RESPIRATORY_TRACT | 11 refills | Status: DC | PRN

## 2017-07-22 NOTE — Telephone Encounter (Signed)
Refill sent to his pharmacy on file.

## 2017-07-22 NOTE — Addendum Note (Signed)
Addended by: Wallis BambergMANI, [place] Noori on: 07/22/2017 03:03 PM   Modules accepted: Orders

## 2017-10-06 ENCOUNTER — Other Ambulatory Visit: Payer: Self-pay | Admitting: Urgent Care

## 2017-10-06 DIAGNOSIS — J4540 Moderate persistent asthma, uncomplicated: Secondary | ICD-10-CM

## 2017-10-12 ENCOUNTER — Other Ambulatory Visit: Payer: Self-pay | Admitting: Urgent Care

## 2017-10-12 ENCOUNTER — Other Ambulatory Visit: Payer: Self-pay | Admitting: *Deleted

## 2017-10-12 DIAGNOSIS — J4540 Moderate persistent asthma, uncomplicated: Secondary | ICD-10-CM

## 2017-10-12 NOTE — Telephone Encounter (Signed)
Prescription called in

## 2019-01-08 ENCOUNTER — Encounter (HOSPITAL_COMMUNITY): Payer: Self-pay | Admitting: Emergency Medicine

## 2019-01-08 ENCOUNTER — Emergency Department (HOSPITAL_COMMUNITY)
Admission: EM | Admit: 2019-01-08 | Discharge: 2019-01-08 | Disposition: A | Discharge status: Home / Self Care | Payer: BLUE CROSS/BLUE SHIELD | Attending: Emergency Medicine | Admitting: Emergency Medicine

## 2019-01-08 ENCOUNTER — Other Ambulatory Visit: Payer: Self-pay

## 2019-01-08 DIAGNOSIS — Z79899 Other long term (current) drug therapy: Secondary | ICD-10-CM | POA: Insufficient documentation

## 2019-01-08 DIAGNOSIS — J45909 Unspecified asthma, uncomplicated: Secondary | ICD-10-CM | POA: Insufficient documentation

## 2019-01-08 DIAGNOSIS — K0889 Other specified disorders of teeth and supporting structures: Secondary | ICD-10-CM | POA: Diagnosis present

## 2019-01-08 DIAGNOSIS — F17210 Nicotine dependence, cigarettes, uncomplicated: Secondary | ICD-10-CM | POA: Diagnosis not present

## 2019-01-08 DIAGNOSIS — K047 Periapical abscess without sinus: Secondary | ICD-10-CM | POA: Diagnosis not present

## 2019-01-08 DIAGNOSIS — K029 Dental caries, unspecified: Secondary | ICD-10-CM

## 2019-01-08 MED ORDER — AMOXICILLIN 500 MG PO CAPS
500.0000 mg | ORAL_CAPSULE | Freq: Once | ORAL | Status: AC
  Administered 2019-01-08: 500 mg via ORAL
  Filled 2019-01-08: qty 1

## 2019-01-08 MED ORDER — AMOXICILLIN 500 MG PO CAPS: 500.0000 mg | ORAL_CAPSULE | Freq: Three times a day (TID) | ORAL | 0 refills | Status: AC

## 2019-01-08 MED ORDER — NAPROXEN 500 MG PO TABS: 500.0000 mg | ORAL_TABLET | Freq: Two times a day (BID) | ORAL | 0 refills | Status: AC

## 2019-01-08 MED ORDER — HYDROCODONE-ACETAMINOPHEN 5-325 MG PO TABS
1.0000 | ORAL_TABLET | Freq: Once | ORAL | Status: AC
  Administered 2019-01-08: 1 via ORAL
  Filled 2019-01-08: qty 1

## 2019-01-08 NOTE — Discharge Instructions (Addendum)
Follow up with your dentist on Monday. Return here as needed.

## 2019-01-08 NOTE — ED Triage Notes (Signed)
Pt c/o R lower dental pain, pt states he lost a filling in the tooth.

## 2019-01-08 NOTE — ED Provider Notes (Signed)
[facility]-EMERGENCY DEPT Provider Note   CSN: [id number] Arrival date & time: 01/08/19  1125    History   Chief Complaint Chief Complaint  Patient presents with  . Dental Pain    HPI Mark Leon is a 33 y.o. male who presents to the ED with dental pain. The pain is located in the right lower dental area. Patient reports he has a dentist but they were closed on Friday and the pain go so bad he could not wait until Monday.    HPI  Past Medical History:  Diagnosis Date  . Allergy   . Asthma     There are no active problems to display for this patient.   Past Surgical History:  Procedure Laterality Date  . SHOULDER SURGERY Left         Home Medications    Prior to Admission medications   Medication Sig Start Date End Date Taking? Authorizing Provider  amoxicillin (AMOXIL) 500 MG capsule Take 1 capsule (500 mg total) by mouth 3 (three) times daily. 01/08/19   Janne Napoleon, NP  buPROPion (WELLBUTRIN XL) 150 MG 24 hr tablet Start with 1 tablet daily for 3 days. Then increase to 1 tablet twice daily thereafter. 06/09/17   Wallis Bamberg, PA-C  cyclobenzaprine (FLEXERIL) 10 MG tablet Take 0.5-1 tablets (5-10 mg total) by mouth at bedtime. 06/16/16   Ofilia Neas, PA-C  naproxen (NAPROSYN) 500 MG tablet Take 1 tablet (500 mg total) by mouth 2 (two) times daily. 01/08/19   Janne Napoleon, NP  orlistat (XENICAL) 120 MG capsule Take 1 capsule (120 mg total) by mouth 3 (three) times daily with meals. 06/09/17   Wallis Bamberg, PA-C  PROAIR HFA [phone]) MCG/ACT inhaler INHALE 2 PUFFS INTO LUNGS EVERY 6 HOURS AS NEEDED FOR WHEEZING OR SHORTNESS OF BREATH 10/08/17   Wallis Bamberg, PA-C    Family History No family history on file.  Social History Social History   Tobacco Use  . Smoking status: Current Every Day Smoker    Packs/day: 0.50    Years: 17.00    Pack years: 8.50    Types: Cigarettes  . Smokeless tobacco: Never Used  Substance Use Topics  .  Alcohol use: Yes  . Drug use: No     Allergies   Patient has no known allergies.   Review of Systems Review of Systems  HENT: Positive for dental problem.   All other systems reviewed and are negative.    Physical Exam Updated Vital Signs BP (!) 163/100   Pulse 90   Temp 99.1 F (37.3 C) (Oral)   Resp 16   SpO2 98%   Physical Exam Vitals signs and nursing note reviewed.  Constitutional:      Appearance: He is well-developed.  HENT:     Head: Normocephalic and atraumatic.     Jaw: No trismus.     Right Ear: Tympanic membrane normal.     Left Ear: Tympanic membrane normal.     Mouth/Throat:     Mouth: Mucous membranes are moist.     Dentition: Dental tenderness and dental caries present.     Pharynx: Oropharynx is clear.   Eyes:     Pupils: Pupils are equal, round, and reactive to light.  Neck:     Musculoskeletal: Neck supple.  Cardiovascular:     Rate and Rhythm: Normal rate and regular rhythm.  Pulmonary:     Effort: No respiratory distress.  Breath sounds: No wheezing.  Abdominal:     Palpations: Abdomen is soft.     Tenderness: There is no abdominal tenderness.  Musculoskeletal: Normal range of motion.  Lymphadenopathy:     Cervical: Cervical adenopathy present.  Skin:    General: Skin is warm and dry.  Neurological:     Mental Status: He is alert and oriented to person, place, and time.     Cranial Nerves: No cranial nerve deficit.  Psychiatric:        Mood and Affect: Mood normal.      ED Treatments / Results  Labs (all labs ordered are listed, but only abnormal results are displayed) Labs Reviewed - No data to display  Radiology No results found.  Procedures Procedures (including critical care time)  Medications Ordered in ED Medications  amoxicillin (AMOXIL) capsule 500 mg (500 mg Oral Given 01/08/19 1422)  HYDROcodone-acetaminophen (NORCO/VICODIN) 5-325 MG per tablet 1 tablet (1 tablet Oral Given 01/08/19 1422)     Initial  Impression / Assessment and Plan / ED Course  I have reviewed the triage vital signs and the nursing notes. Patient with toothache.  No gross abscess.  Exam unconcerning for Ludwig's angina or spread of infection.  Will treat with Amoxicilllin and anti-inflammatories medicine.  Urged patient to follow-up with dentist tomorrow as scheduled.     Final Clinical Impressions(s) / ED Diagnoses   Final diagnoses:  Infected dental caries    ED Discharge Orders         Ordered    amoxicillin (AMOXIL) 500 MG capsule  3 times daily     01/08/19 1418    naproxen (NAPROSYN) 500 MG tablet  2 times daily     01/08/19 [address] [place], [place] 01/08/19 2106    Gerhard Munch, MD 01/12/19 1901

## 2021-05-27 ENCOUNTER — Other Ambulatory Visit (HOSPITAL_BASED_OUTPATIENT_CLINIC_OR_DEPARTMENT_OTHER): Payer: Self-pay

## 2021-05-27 DIAGNOSIS — R0683 Snoring: Secondary | ICD-10-CM

## 2021-05-27 DIAGNOSIS — G4719 Other hypersomnia: Secondary | ICD-10-CM

## 2021-05-27 DIAGNOSIS — R5383 Other fatigue: Secondary | ICD-10-CM

## 2021-07-10 ENCOUNTER — Ambulatory Visit (INDEPENDENT_AMBULATORY_CARE_PROVIDER_SITE_OTHER): Payer: BC Managed Care – PPO | Admitting: Psychology

## 2021-07-10 DIAGNOSIS — F411 Generalized anxiety disorder: Secondary | ICD-10-CM

## 2021-07-29 ENCOUNTER — Ambulatory Visit (INDEPENDENT_AMBULATORY_CARE_PROVIDER_SITE_OTHER): Payer: BC Managed Care – PPO | Admitting: Psychology

## 2021-07-29 DIAGNOSIS — F411 Generalized anxiety disorder: Secondary | ICD-10-CM

## 2021-08-12 ENCOUNTER — Ambulatory Visit (INDEPENDENT_AMBULATORY_CARE_PROVIDER_SITE_OTHER): Payer: BC Managed Care – PPO | Admitting: Psychology

## 2021-08-12 DIAGNOSIS — F411 Generalized anxiety disorder: Secondary | ICD-10-CM | POA: Diagnosis not present

## 2021-09-09 ENCOUNTER — Ambulatory Visit: Payer: BC Managed Care – PPO | Admitting: Psychology
